# Patient Record
Sex: Female | Born: 1965 | Race: White | Hispanic: No | Marital: Single | Smoking: Current every day smoker
Health system: Southern US, Community
[De-identification: ages and names within clinical notes are randomized; demographics above are authoritative.]

## PROBLEM LIST (undated history)

## (undated) DIAGNOSIS — I10 Essential (primary) hypertension: Secondary | ICD-10-CM

## (undated) DIAGNOSIS — K219 Gastro-esophageal reflux disease without esophagitis: Secondary | ICD-10-CM

## (undated) DIAGNOSIS — E785 Hyperlipidemia, unspecified: Secondary | ICD-10-CM

## (undated) HISTORY — PX: BLADDER SURGERY: SHX569

## (undated) HISTORY — DX: Hyperlipidemia, unspecified: E78.5

---

## 2005-11-20 ENCOUNTER — Emergency Department: Payer: Self-pay | Admitting: Emergency Medicine

## 2006-09-24 ENCOUNTER — Ambulatory Visit: Payer: Self-pay | Admitting: Unknown Physician Specialty

## 2007-02-03 ENCOUNTER — Emergency Department: Payer: Self-pay | Admitting: Emergency Medicine

## 2008-04-02 ENCOUNTER — Emergency Department: Payer: Self-pay | Admitting: Emergency Medicine

## 2010-03-20 ENCOUNTER — Emergency Department: Payer: Self-pay | Admitting: Emergency Medicine

## 2010-10-26 ENCOUNTER — Emergency Department: Payer: Self-pay | Admitting: Emergency Medicine

## 2011-05-14 ENCOUNTER — Emergency Department: Payer: Self-pay | Admitting: Emergency Medicine

## 2012-09-24 ENCOUNTER — Emergency Department: Payer: Self-pay | Admitting: Emergency Medicine

## 2012-09-24 LAB — COMPREHENSIVE METABOLIC PANEL
Albumin: 3.6 g/dL (ref 3.4–5.0)
Alkaline Phosphatase: 86 U/L (ref 50–136)
Anion Gap: 5 — ABNORMAL LOW (ref 7–16)
BUN: 7 mg/dL (ref 7–18)
Bilirubin,Total: 0.5 mg/dL (ref 0.2–1.0)
Calcium, Total: 8.7 mg/dL (ref 8.5–10.1)
Chloride: 105 mmol/L (ref 98–107)
Creatinine: 0.77 mg/dL (ref 0.60–1.30)
EGFR (African American): >60
EGFR (Non-African Amer.): >60
Glucose: 139 mg/dL — ABNORMAL HIGH (ref 65–99)
Osmolality: 280 (ref 275–301)
Potassium: 4.2 mmol/L (ref 3.5–5.1)
SGOT(AST): 22 U/L (ref 15–37)
SGPT (ALT): 22 U/L (ref 12–78)
Sodium: 140 mmol/L (ref 136–145)

## 2012-09-24 LAB — CK TOTAL AND CKMB (NOT AT ARMC)
CK, Total: 100 U/L (ref 21–215)
CK-MB: 1.2 ng/mL (ref 0.5–3.6)

## 2012-09-24 LAB — CBC
HCT: 44.1 % (ref 35.0–47.0)
HGB: 15.5 g/dL (ref 12.0–16.0)
MCHC: 35.2 g/dL (ref 32.0–36.0)
RBC: 4.6 10*6/uL (ref 3.80–5.20)
RDW: 12 % (ref 11.5–14.5)

## 2012-09-24 LAB — ETHANOL
Ethanol %: <0.003 % (ref 0.000–0.080)
Ethanol: <3 mg/dL

## 2012-09-24 LAB — TROPONIN I: Troponin-I: <0.02 ng/mL

## 2013-07-29 ENCOUNTER — Emergency Department: Payer: Self-pay | Admitting: Emergency Medicine

## 2014-01-16 ENCOUNTER — Emergency Department: Payer: Self-pay | Admitting: Emergency Medicine

## 2014-07-05 ENCOUNTER — Emergency Department: Payer: Self-pay | Admitting: Emergency Medicine

## 2014-09-02 ENCOUNTER — Emergency Department: Payer: Self-pay | Admitting: Emergency Medicine

## 2014-09-02 LAB — URINALYSIS, COMPLETE
BILIRUBIN, UR: NEGATIVE
Bacteria: NONE SEEN
Glucose,UR: NEGATIVE mg/dL (ref 0–75)
KETONE: NEGATIVE
NITRITE: NEGATIVE
Ph: 6 (ref 4.5–8.0)
Protein: 30
SPECIFIC GRAVITY: 1.004 (ref 1.003–1.030)
Squamous Epithelial: NONE SEEN

## 2015-08-16 ENCOUNTER — Encounter: Payer: Self-pay | Admitting: Emergency Medicine

## 2015-08-16 ENCOUNTER — Emergency Department
Admission: EM | Admit: 2015-08-16 | Discharge: 2015-08-16 | Disposition: A | Discharge status: Home / Self Care | Payer: Self-pay | Attending: Emergency Medicine | Admitting: Emergency Medicine

## 2015-08-16 DIAGNOSIS — Z79899 Other long term (current) drug therapy: Secondary | ICD-10-CM | POA: Insufficient documentation

## 2015-08-16 DIAGNOSIS — I10 Essential (primary) hypertension: Secondary | ICD-10-CM | POA: Insufficient documentation

## 2015-08-16 DIAGNOSIS — K047 Periapical abscess without sinus: Secondary | ICD-10-CM | POA: Insufficient documentation

## 2015-08-16 DIAGNOSIS — Z880 Allergy status to penicillin: Secondary | ICD-10-CM | POA: Insufficient documentation

## 2015-08-16 DIAGNOSIS — F17200 Nicotine dependence, unspecified, uncomplicated: Secondary | ICD-10-CM | POA: Insufficient documentation

## 2015-08-16 HISTORY — DX: Gastro-esophageal reflux disease without esophagitis: K21.9

## 2015-08-16 HISTORY — DX: Essential (primary) hypertension: I10

## 2015-08-16 MED ORDER — CLINDAMYCIN HCL 300 MG PO CAPS: 300.0000 mg | ORAL_CAPSULE | Freq: Three times a day (TID) | ORAL | Status: DC

## 2015-08-16 MED ORDER — OXYCODONE-ACETAMINOPHEN 5-325 MG PO TABS: 1.0000 | ORAL_TABLET | ORAL | Status: DC | PRN

## 2015-08-16 NOTE — ED Provider Notes (Signed)
[facility] Emergency Department Provider Note ____________________________________________  Time seen: Approximately 7:36 AM  I have reviewed the triage vital signs and the nursing notes.   HISTORY  Chief Complaint Dental Pain    HPI Michele Bernard is a 50 y.o. female who presents today with pain and swelling to the lower left side of her jaw. She does not currently have a dentist and states she had a loose tooth and extracted it herself on Monday. Has been treating by swishing with warm salt water. It was not painful until yesterday when she noticed swelling in her jaw and pain started to increase. Her boyfriend had some Keflex left over from a previous tooth infection he had, so she took 2 Keflex last night and 2 ibuprofen 800 with minimal relief. She is able to eat on other side of her mouth and unable on the left side. Patient states that she has felt feverish and had chills but has not taken her temperature. Patient denies headache, swelling of neck, shortness of breath, dyspnea, chest pain.   Past Medical History  Diagnosis Date  . Hypertension   . GERD (gastroesophageal reflux disease)     There are no active problems to display for this patient.   History reviewed. No pertinent past surgical history.  Current Outpatient Rx  Name  Route  Sig  Dispense  Refill  . omeprazole (PRILOSEC) 40 MG capsule   Oral   Take 40 mg by mouth daily.         . clindamycin (CLEOCIN) 300 MG capsule   Oral   Take 1 capsule (300 mg total) by mouth 3 (three) times daily.   30 capsule   0   . oxyCODONE-acetaminophen (ROXICET) 5-325 MG tablet   Oral   Take 1-2 tablets by mouth every 4 (four) hours as needed for severe pain.   15 tablet   0     Allergies Penicillins  No family history on file.  Social History Social History  Substance Use Topics  . Smoking status: Current Every Day Smoker  . Smokeless tobacco: None  . Alcohol Use: No    Review of  Systems Constitutional: Possible fever/chills Eyes: No visual changes. ENT: Positive for swelling and pain to jaw. No sore throat. Cardiovascular: Denies chest pain. Respiratory: Denies shortness of breath or difficulty breathing Gastrointestinal: No abdominal pain.  No nausea, no vomiting.  No diarrhea.  No constipation. Genitourinary: Negative for dysuria. Musculoskeletal: Negative for back pain. Skin: Negative for rash. Neurological: Negative for headaches, focal weakness or numbness. Lymphatic:Denies swelling in the neck  10-point ROS otherwise negative.  ____________________________________________   PHYSICAL EXAM:  VITAL SIGNS: ED Triage Vitals  Enc Vitals Group     BP 08/16/15 0718 161/98 mmHg     Pulse Rate 08/16/15 0718 94     Resp 08/16/15 0718 18     Temp 08/16/15 0718 97.6 F (36.4 C)     Temp src --      SpO2 08/16/15 0718 96 %     Weight 08/16/15 0718 130 lb (58.968 kg)     Height 08/16/15 0718  (1.676 m)     Head Cir --      Peak Flow --      Pain Score 08/16/15 0719 10     Pain Loc --      Pain Edu? --      Excl. in GC? --     Constitutional: Alert and oriented. Well appearing  and in no acute distress. Head: Atraumatic. Nose: No congestion/rhinnorhea. Mouth/Throat: Evidence of recently extracted lower left first molar. No exudates or active bleeding. Swelling to left side of mouth noted. Mucous membranes are moist.  Oropharynx non-erythematous. Neck: No stridor.  Lymphatic/Immunilogical: No cervical lymphadenopathy. Cardiovascular: Normal rate, regular rhythm. Grossly normal heart sounds.  Good peripheral circulation. Respiratory: Normal respiratory effort.  No retractions. Lungs CTAB. Musculoskeletal: No lower extremity tenderness nor edema.  No joint effusions. Neurologic:  Normal speech and language. No gross focal neurologic deficits are appreciated. No gait instability. Skin:  Skin is warm, dry and intact. No rash noted. Psychiatric: Mood  and affect are normal. Speech and behavior are normal.  ____________________________________________   LABS (all labs ordered are listed, but only abnormal results are displayed)  Labs Reviewed - No data to display ____________________________________________   PROCEDURES  Procedure(s) performed: None  Critical Care performed: No  ____________________________________________   INITIAL IMPRESSION / ASSESSMENT AND PLAN / ED COURSE  Pertinent labs & imaging results that were available during my care of the patient were reviewed by me and considered in my medical decision making (see chart for details).  Michele Bernard is a 50 y.o. female who presents today with pain and swelling to the lower left side of her jaw after self extracting a tooth which has now become infected. Will give her Percocet for pain and clindamycin 300 and give her follow-up info for a dental clinic. Patient voices no other emergency medical complaints at this visit. ____________________________________________   FINAL CLINICAL IMPRESSION(S) / ED DIAGNOSES  Final diagnoses:  Dental abscess      Evangeline Dakin, PA-C 08/16/15 1610  Richardean Canal, MD 08/16/15 [phone]

## 2015-08-16 NOTE — ED Notes (Signed)
Pt discharged home after verbalizing understanding of discharge instructions; nad noted. 

## 2015-08-16 NOTE — ED Notes (Addendum)
Swelling noted to left side of face  States she pulled her own tooth on monday

## 2015-08-16 NOTE — Discharge Instructions (Signed)
OPTIONS FOR DENTAL FOLLOW UP CARE ° °[place] [facility] and [facility] Services - Local Safety Net Dental Clinics °http://www.ncdhhs.gov/dph/oralhealth/services/safetynetclinics.htm °  °[facility] (336-562-3123) ° °[facility] [place] (919-933-9087) ° °[facility] (919-663-1744 ext [phone]) ° °[facility] ([phone]) ° °[facility] ([phone]) °This clinic caters to the indigent population and is on a lottery system. °Location: °[facility], [address], [place] °Clinic Hours: °Wednesdays from 6pm - 9pm, patients seen by a lottery system. °For dates, call or go to [email or web address]/[facility]/patients/Dental-[facility] °Services: °Cleanings, fillings and simple extractions. °Payment Options: °DENTAL WORK IS FREE OF CHARGE. Bring proof of income or support. °Best way to get seen: °Arrive at 5:15 pm - this is a lottery, NOT first come/first serve, so arriving earlier will not increase your chances of being seen. °  °  °[facility] °[phone] °Select option 1 for emergencies °  °Location: °[facility], [address], [place] °Clinic Hours: °No walk-ins accepted - call the day before to schedule an appointment. °Check in times are 9:30 am and 1:30 pm. °Services: °Simple extractions, temporary fillings, pulpectomy/pulp debridement, uncomplicated abscess drainage. °Payment Options: °PAYMENT IS DUE AT THE TIME OF SERVICE.  Fee is usually $100-200, additional surgical procedures (e.g. abscess drainage) may be extra. °Cash, checks, Visa/MasterCard accepted.  Can file Medicaid if patient is covered for dental - patient should call case worker to check. °No discount for [facility] patients. °Best way to get seen: °MUST call the day before and get onto the schedule. Can usually be seen the next 1-2 days. No walk-ins accepted. °  °  °[facility] °[phone] °   °Location: °[facility], [address], [place] °Clinic Hours: °M, W, Th, F 8am or 1:30pm, Tues 9a or 1:30 - first come/first served. °Services: °Simple extractions, temporary fillings, uncomplicated abscess drainage.  You do not need to be an [place] resident. °Payment Options: °PAYMENT IS DUE AT THE TIME OF SERVICE. °Dental insurance, otherwise sliding scale - bring proof of income or support. °Depending on income and treatment needed, cost is usually $50-200. °Best way to get seen: °Arrive early as it is first come/first served. °  °  °[facility] °[phone] °  °Location: °[address] °Clinic Hours: °Mon-Thu 8a-5p °Services: °Most basic dental services including extractions and fillings. °Payment Options: °PAYMENT IS DUE AT THE TIME OF SERVICE. °Sliding scale, up to 50% off - bring proof if income or support. °Medicaid with dental option accepted. °Best way to get seen: °Call to schedule an appointment, can usually be seen within 2 weeks OR they will try to see walk-ins - show up at 8a or 2p (you may have to wait). °  °  °[facility] °919-245-2435 °[place] RESIDENTS ONLY °  °Location: °[facility], [address], [place],  [address] °Clinic Hours: By appointment only. °Monday - Thursday 8am-5pm, Friday 8am-12pm °Services: Cleanings, fillings, extractions. °Payment Options: °PAYMENT IS DUE AT THE TIME OF SERVICE. °Cash, Visa or MasterCard. Sliding scale - $30 minimum per service. °Best way to get seen: °Come in to office, complete packet and make an appointment - need proof of income °or support monies for each household member and proof of [place] residence. °Usually takes about a month to get in. °  °  °[facility] °[phone] °  °Location: °[address]   [place] °Clinic Hours: Walk-in Urgent Care Dental Services are offered Monday-Friday  mornings only. °The numbers of emergencies accepted daily is limited to the number of °providers available. °Maximum 15 - Mondays, Wednesdays & Thursdays °Maximum 10 - Tuesdays & Fridays °Services: °You do not need to be a [place] resident to be seen for a dental emergency. °Emergencies are defined as pain, swelling, abnormal bleeding, or dental trauma. Walkins will receive x-rays if needed. °NOTE: Dental cleaning is not an emergency. °Payment Options: °PAYMENT IS DUE AT THE TIME OF SERVICE. °Minimum co-pay is $40.00 for uninsured patients. °Minimum co-pay is $3.00 for Medicaid with dental coverage. °Dental Insurance is accepted and must be presented at time of visit. °Medicare does not cover dental. °Forms of payment: Cash, credit card, checks. °Best way to get seen: °If not previously registered with the clinic, walk-in dental registration begins at 7:15 am and is on a first come/first serve basis. °If previously registered with the clinic, call to make an appointment. °  °  °[facility] °919-776-4359 °[place] RESIDENTS ONLY °  °Location: °[address], [place], [place] °Clinic Hours: °Mon-Thu 10a-2p °Services: Extractions only! °Payment Options: °FREE (donations accepted) - bring proof of income or support °Best way to get seen: °Call and schedule an appointment OR come at 8am on the 1st Monday of every month (except for holidays) when it is first come/first served. °  °  °[facility] °[phone] °  °Location: °[address], [place] °Clinic Hours: °Friday mornings °Services, Payment Options, Best way to get seen: °Call for info ° ° °Dental Abscess °A dental abscess is a collection of pus in or around a tooth. °CAUSES °This condition is caused by a bacterial infection around the root of the tooth that involves the inner part of the tooth (pulp). It may result from: °· Severe tooth decay. °· Trauma to the tooth that allows bacteria to enter into the pulp, such as a broken or chipped  tooth. °· Severe gum disease around a tooth. °SYMPTOMS °Symptoms of this condition include: °· Severe pain in and around the infected tooth. °· Swelling and redness around the infected tooth, in the mouth, or in the face. °· Tenderness. °· Pus drainage. °· Bad breath. °· Bitter taste in the mouth. °· Difficulty swallowing. °· Difficulty opening the mouth. °· Nausea. °· Vomiting. °· Chills. °· Swollen neck glands. °· Fever. °DIAGNOSIS °This condition is diagnosed with examination of the infected tooth. During the exam, your dentist may tap on the infected tooth. Your dentist will also ask about your medical and dental history and may order X-rays. °TREATMENT °This condition is treated by eliminating the infection. This may be done with: °· Antibiotic medicine. °· A root canal. This may be performed to save the tooth. °· Pulling (extracting) the tooth. This may also involve draining the abscess. This is done if the tooth cannot be saved. °HOME CARE INSTRUCTIONS °· Take medicines only as directed by your dentist. °· If you were prescribed antibiotic medicine, finish all of it even if you start to feel better. °· Rinse your mouth (gargle) often with salt water to relieve pain or swelling. °· Do not drive or operate heavy machinery while taking pain medicine. °· Do not apply heat to the outside of your mouth. °· Keep all follow-up visits as directed by your dentist. This is important. °SEEK MEDICAL CARE IF: °· Your pain is worse and is not helped by medicine. °SEEK IMMEDIATE MEDICAL CARE IF: °·   You have a fever or chills.  Your symptoms suddenly get worse.  You have a very bad headache.  You have problems breathing or swallowing.  You have trouble opening your mouth.  You have swelling in your neck or around your eye.   This information is not intended to replace advice given to you by your health care provider. Make sure you discuss any questions you have with your health care provider.   Document  Released: 07/14/2005 Document Revised: 11/28/2014 Document Reviewed: 07/11/2014 Elsevier Interactive Patient Education 2016 Else[employer] Inc[employer]  Dental Care and Dentist Visits Dental care supports good overall health. Regular dental visits can also help you avoid dental pain, bleeding, infection, and other more serious health problems in the future. It is important to keep the mouth healthy because diseases in the teeth, gums, and other oral tissues can spread to other areas of the body. Some problems, such as diabetes, heart disease, and pre-term labor have been associated with poor oral health.  See your dentist every 6 months. If you experience emergency problems such as a toothache or broken tooth, go to the dentist right away. If you see your dentist regularly, you may catch problems early. It is easier to be treated for problems in the early stages.  WHAT TO EXPECT AT A DENTIST VISIT  Your dentist will look for many common oral health problems and recommend proper treatment. At your regular dental visit, you can expect:  Gentle cleaning of the teeth and gums. This includes scraping and polishing. This helps to remove the sticky substance around the teeth and gums (plaque). Plaque forms in the mouth shortly after eating. Over time, plaque hardens on the teeth as tartar. If tartar is not removed regularly, it can cause problems. Cleaning also helps remove stains.  Periodic X-rays. These pictures of the teeth and supporting bone will help your dentist assess the health of your teeth.  Periodic fluoride treatments. Fluoride is a natural mineral shown to help strengthen teeth. Fluoride treatmentinvolves applying a fluoride gel or varnish to the teeth. It is most commonly done in children.  Examination of the mouth, tongue, jaws, teeth, and gums to look for any oral health problems, such as:  Cavities (dental caries). This is decay on the tooth caused by plaque, sugar, and acid in the mouth. It is best  to catch a cavity when it is small.  Inflammation of the gums caused by plaque buildup (gingivitis).  Problems with the mouth or malformed or misaligned teeth.  Oral cancer or other diseases of the soft tissues or jaws. KEEP YOUR TEETH AND GUMS HEALTHY For healthy teeth and gums, follow these general guidelines as well as your dentist's specific advice:  Have your teeth professionally cleaned at the dentist every 6 months.  Brush twice daily with a fluoride toothpaste.  Floss your teeth daily.  Ask your dentist if you need fluoride supplements, treatments, or fluoride toothpaste.  Eat a healthy diet. Reduce foods and drinks with added sugar.  Avoid smoking. TREATMENT FOR ORAL HEALTH PROBLEMS If you have oral health problems, treatment varies depending on the conditions present in your teeth and gums.  Your caregiver will most likely recommend good oral hygiene at each visit.  For cavities, gingivitis, or other oral health disease, your caregiver will perform a procedure to treat the problem. This is typically done at a separate appointment. Sometimes your caregiver will refer you to another dental specialist for specific tooth problems or for surgery. SEEK IMMEDIATE DENTAL  CARE IF:  You have pain, bleeding, or soreness in the gum, tooth, jaw, or mouth area.  A permanent tooth becomes loose or separated from the gum socket.  You experience a blow or injury to the mouth or jaw area.   This information is not intended to replace advice given to you by your health care provider. Make sure you discuss any questions you have with your health care provider.   Document Released: 03/26/2011 Document Revised: 10/06/2011 Document Reviewed: 03/26/2011 Elsevier Interactive Patient Education [employer]

## 2015-08-16 NOTE — ED Notes (Addendum)
Pt reports that she had a loose tooth that she pulled out from her left side on Monday; states she is having pain that "feels like a dry socket." States she took 2 leftover keflex last night that were left over from boyfriend's dental problem. Pt states she is having 10/10 pain as well as swelling. Has been taking ibuprofen and goody powders with no relief.

## 2016-03-12 ENCOUNTER — Encounter (INDEPENDENT_AMBULATORY_CARE_PROVIDER_SITE_OTHER): Payer: Self-pay

## 2016-03-12 ENCOUNTER — Ambulatory Visit
Admission: RE | Admit: 2016-03-12 | Discharge: 2016-03-12 | Disposition: A | Discharge status: Home / Self Care | Payer: Self-pay | Source: Ambulatory Visit | Attending: Oncology | Admitting: Oncology

## 2016-03-12 ENCOUNTER — Ambulatory Visit: Payer: Self-pay | Attending: Internal Medicine | Admitting: *Deleted

## 2016-03-12 ENCOUNTER — Encounter: Payer: Self-pay | Admitting: *Deleted

## 2016-03-12 VITALS — BP 147/91 | HR 82 | Temp 97.9°F | Ht 66.54 in | Wt 121.3 lb

## 2016-03-12 DIAGNOSIS — Z0000 Encounter for general adult medical examination without abnormal findings: Secondary | ICD-10-CM

## 2016-03-12 NOTE — Progress Notes (Signed)
Subjective:     Patient ID: Michele Bernard, female   DOB: May 07, 1966, 50 y.o.   MRN: [id number]  HPI   Review of Systems     Objective:   Physical Exam  Pulmonary/Chest: Right breast exhibits no inverted nipple, no mass, no nipple discharge, no skin change and no tenderness. Left breast exhibits no inverted nipple, no mass, no nipple discharge, no skin change and no tenderness. Breasts are symmetrical.  Abdominal: There is no splenomegaly or hepatomegaly.  Genitourinary:    No labial fusion. There is no rash, tenderness, lesion or injury on the right labia. There is no rash, tenderness, lesion or injury on the left labia. Cervix exhibits no motion tenderness, no discharge and no friability. Right adnexum displays no mass, no tenderness and no fullness. Left adnexum displays no mass, no tenderness and no fullness. No erythema, tenderness or bleeding in the vagina. No foreign body in the vagina. No signs of injury around the vagina. No vaginal discharge found.       Assessment:     50 year old White female [facility], pap and mammogram.  Clinical breast exam unremarkable.  Taught self breast awareness.  Specimen collected for pap smear.  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  Blood pressure elevated at 147/91.  She is to recheck her blood pressure at [facility] or [facility], and if remains higher than 140/90 she is to follow-up with her primary care provider.  Hand out on hypertention given to patient.  States she just started on blood pressure meds and has a follow-up appointment on the 24th of this month.  Patient has been screened for eligibility.  She does not have any insurance, Medicare or Medicaid.  She also meets financial eligibility.  Hand-out given on the Affordable Care Act.    Plan:     Screening mammogram ordered.  Specimen sent to the lab.  Patient scheduled to see a GYN at [facility] on 04/16/16 for  further evaluation of the urethral caruncle.  Will follow up per [facility] protocol.

## 2016-03-12 NOTE — Patient Instructions (Signed)
Human Papillomavirus Human papillomavirus (HPV) is the most common sexually transmitted infection (STI) and is highly contagious. HPV infections cause genital warts and cancers to the outlet of the womb (cervix), birth canal (vagina), opening of the birth canal (vulva), and anus. There are over 100 types of HPV. Unless wartlike lesions are present in the throat or there are genital warts that you can see or feel, HPV usually does not cause symptoms. It is possible to be infected for long periods and pass it on to others without knowing it. CAUSES  HPV is spread from person to person through sexual contact. This includes oral, vaginal, or anal sex. RISK FACTORS  Having unprotected sex. HPV can be spread by oral, vaginal, or anal sex.  Having several sex partners.  Having a sex partner who has other sex partners.  Having or having had another sexually transmitted infection. SIGNS AND SYMPTOMS  Most people carrying HPV do not have any symptoms. If symptoms are present, symptoms may include:  Wartlike lesions in the throat (from having oral sex).  Warts in the infected skin or mucous membranes.  Genital warts that may itch, burn, or bleed.  Genital warts that may be painful or bleed during sexual intercourse. DIAGNOSIS  If wartlike lesions are present in the throat or genital warts are present, your health care provider can usually diagnose HPV by physical examination.   Genital warts are easily seen with the naked eye.  Currently, there is no FDA-approved test to detect HPV in males.  In females, a Pap test can show cells that are infected with HPV.  In females, a scope can be used to view the cervix (colposcopy). A colposcopy can be performed if the pelvic exam or Pap test is abnormal. A sample of tissue may be removed (biopsy) during the colposcopy. TREATMENT  There is no treatment for the virus itself. However, there are treatments for the health problems and symptoms HPV can cause.  Your health care provider will follow you closely after you are treated. This is because the HPV can come back and may need treatment again. Treatment of HPV may include:   Medicines, which may be injected or applied in a cream, lotion, or gel form.  Use of a probe to apply extreme cold (cryotherapy).  Application of an intense beam of light (laser treatment).  Use of a probe to apply extreme heat (electrocautery).  Surgery. HOME CARE INSTRUCTIONS   Take medicines only as directed by your health care provider.  Use over-the-counter creams for itching or irritation as directed by your health care provider.  Keep all follow-up visits as directed by your health care provider. This is important.  Do not touch or scratch the warts.  Do not treat genital warts with medicines used for treating hand warts.  Do not have sex while you are being treated.  Do not douche or use tampons during treatment of HPV.  Tell your sex partner about your infection because he or she may also need treatment.  If you become pregnant, tell your health care provider that you have had HPV. Your health care provider will monitor you closely during pregnancy to be sure your baby is safe.  After treatment, use condoms during sex to prevent future infections.  Have only one sex partner.  Have a sex partner who does not have other sex partners. PREVENTION   Talk to your health care provider about getting the HPV vaccines. These vaccines prevent some HPV infections and cancers.   It is recommended that the vaccine be given to males and females between the ages of 9 and 26 years old. It will not work if you already have HPV, and it is not recommended for pregnant women.  A Pap test is done to screen for cervical cancer in women.  The first Pap test should be done at age 21 years.  Between ages 21 and 29 years, Pap tests are repeated every 2 years.  Beginning at age 30, you are advised to have a Pap test every  3 years as long as your past 3 Pap tests have been normal.  Some women have medical problems that increase the chance of getting cervical cancer. Talk to your health care provider about these problems. It is especially important to talk to your health care provider if a new problem develops soon after your last Pap test. In these cases, your health care provider may recommend more frequent screening and Pap tests.  The above recommendations are the same for women who have or have not gotten the vaccine for HPV.  If you had a hysterectomy for a problem that was not a cancer or a condition that could lead to cancer, then you no longer need Pap tests. However, even if you no longer need a Pap test, a regular exam is a good idea to make sure no other problems are starting.   If you are between the ages of 65 and 70 years and you have had normal Pap tests going back 10 years, you no longer need Pap tests. However, even if you no longer need a Pap test, a regular exam is a good idea to make sure no other problems are starting.  If you have had past treatment for cervical cancer or a condition that could lead to cancer, you need Pap tests and screening for cancer for at least 20 years after your treatment.  If Pap tests have been discontinued, risk factors (such as a new sexual partner)need to be reassessed to determine if screening should be resumed.  Some women may need screenings more often if they are at high risk for cervical cancer. SEEK MEDICAL CARE IF:   The treated skin becomes red, swollen, or painful.  You have a fever.  You feel generally ill.  You feel lumps or pimple-like projections in and around your genital area.  You develop bleeding of the vagina or the treatment area.  You have painful sexual intercourse. MAKE SURE YOU:   Understand these instructions.  Will watch your condition.  Will get help if you are not doing well or get worse.   This information is not  intended to replace advice given to you by your health care provider. Make sure you discuss any questions you have with your health care provider.   Document Released: 10/04/2003 Document Revised: 08/04/2014 Document Reviewed: 10/19/2013 Elsevier Interactive Patient Education 2016 [employer]vier Inc.   Gave patient hand-out, Women Staying Healthy, Active and Well from [facility], with education on breast health, pap smears, heart and colon health. 

## 2016-03-13 ENCOUNTER — Other Ambulatory Visit: Payer: Self-pay | Admitting: *Deleted

## 2016-03-13 DIAGNOSIS — N63 Unspecified lump in unspecified breast: Secondary | ICD-10-CM

## 2016-03-14 LAB — PAP LB AND HPV HIGH-RISK
HPV, high-risk: NEGATIVE
PAP SMEAR COMMENT: 0

## 2016-03-18 ENCOUNTER — Other Ambulatory Visit: Payer: Self-pay | Admitting: *Deleted

## 2016-03-18 DIAGNOSIS — N63 Unspecified lump in unspecified breast: Secondary | ICD-10-CM

## 2016-04-02 ENCOUNTER — Ambulatory Visit: Payer: Self-pay

## 2016-04-02 ENCOUNTER — Ambulatory Visit: Admission: RE | Admit: 2016-04-02 | Payer: Self-pay | Source: Ambulatory Visit

## 2016-04-16 ENCOUNTER — Ambulatory Visit (INDEPENDENT_AMBULATORY_CARE_PROVIDER_SITE_OTHER): Payer: Self-pay | Admitting: Obstetrics and Gynecology

## 2016-04-16 ENCOUNTER — Encounter: Payer: Self-pay | Admitting: Obstetrics and Gynecology

## 2016-04-16 VITALS — BP 129/74 | HR 81 | Ht 66.0 in | Wt 120.2 lb

## 2016-04-16 DIAGNOSIS — N951 Menopausal and female climacteric states: Secondary | ICD-10-CM

## 2016-04-16 DIAGNOSIS — N362 Urethral caruncle: Secondary | ICD-10-CM

## 2016-04-16 NOTE — Progress Notes (Signed)
    GYNECOLOGY PROGRESS NOTE  Subjective:    Patient ID: Michele Bernard, female    DOB: Dec 10, 1965, 50 y.o.   MRN: [id number]  HPI  Patient is a 50 y.o. G2P2 menopausal female who [facility]. Patient denies complaints.     Past Medical History:  Diagnosis Date  . GERD (gastroesophageal reflux disease)   . Hypertension     Family History  Problem Relation Age of Onset  . Hypertension Mother   . Breast cancer Neg Hx    Past Surgical History:  Procedure Laterality Date  . BLADDER SURGERY     Bladder tact    . Social History   Social History  . Marital status: Single    Spouse name: N/A  . Number of children: N/A  . Years of education: N/A   Occupational History  . Not on file.   Social History Main Topics  . Smoking status: Current Every Day Smoker    Packs/day: 1.00    Years: 30.00    Types: Cigarettes  . Smokeless tobacco: Never Used  . Alcohol use No  . Drug use: No  . Sexual activity: Yes    Birth control/ protection: Post-menopausal   Other Topics Concern  . Not on file   Social History Narrative  . No narrative on file    Current Outpatient Prescriptions on File Prior to Visit  Medication Sig Dispense Refill  . omeprazole (PRILOSEC) 40 MG capsule Take 40 mg by mouth daily.     No current facility-administered medications on file prior to visit.     Allergies  Allergen Reactions  . Penicillins Hives     Review of Systems A comprehensive review of systems was negative.   Objective:   Blood pressure 129/74, pulse 81, height 5\' 6"  (1.676 m), weight 120 lb 3.2 oz (54.5 kg). General appearance: alert and no distress Pelvic: external genitalia normal, rectovaginal septum normal. Urethral caruncle present, nontender. Speculum exam not performed.  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  - Samples of premarin given. Use daily x 1 week, then decrease to twice weekly. If no improvement or resolution, may need referral to Urology for possible biopsy to rule out pathology.  F/u in 2 months.     Hildred LaserAnika Bona Hubbard, MD [facility]

## 2016-04-16 NOTE — Patient Instructions (Signed)
Use estrogen cream daily x 1 week, then decrease to twice weekly.  Use cream for total of 8 weeks.

## 2016-04-18 ENCOUNTER — Ambulatory Visit: Payer: Self-pay

## 2016-04-18 ENCOUNTER — Other Ambulatory Visit: Payer: Self-pay

## 2016-04-19 DIAGNOSIS — N362 Urethral caruncle: Secondary | ICD-10-CM | POA: Insufficient documentation

## 2016-05-08 ENCOUNTER — Ambulatory Visit: Admission: RE | Admit: 2016-05-08 | Payer: Self-pay | Source: Ambulatory Visit

## 2016-05-08 ENCOUNTER — Other Ambulatory Visit: Payer: Self-pay

## 2016-05-22 ENCOUNTER — Encounter: Payer: Self-pay | Admitting: *Deleted

## 2016-05-22 ENCOUNTER — Telehealth: Payer: Self-pay | Admitting: *Deleted

## 2016-05-22 NOTE — Telephone Encounter (Signed)
Patient no-showed for her additional views.  Tried to phone patient to reschedule her appointment, but no answer.   Unable to leave a message.  Certified letter mailed to patient to stress importance of follow-up.

## 2016-06-11 ENCOUNTER — Ambulatory Visit: Payer: Self-pay | Admitting: Obstetrics and Gynecology

## 2016-06-17 ENCOUNTER — Ambulatory Visit
Admission: RE | Admit: 2016-06-17 | Discharge: 2016-06-17 | Disposition: A | Discharge status: Home / Self Care | Payer: Self-pay | Source: Ambulatory Visit | Attending: Oncology | Admitting: Oncology

## 2016-06-17 ENCOUNTER — Other Ambulatory Visit: Payer: Self-pay

## 2016-06-17 DIAGNOSIS — N63 Unspecified lump in unspecified breast: Secondary | ICD-10-CM

## 2016-07-03 ENCOUNTER — Ambulatory Visit
Admission: RE | Admit: 2016-07-03 | Discharge: 2016-07-03 | Disposition: A | Discharge status: Home / Self Care | Payer: Self-pay | Source: Ambulatory Visit | Attending: Oncology | Admitting: Oncology

## 2016-07-03 ENCOUNTER — Ambulatory Visit: Payer: Self-pay | Admitting: Obstetrics and Gynecology

## 2016-07-03 DIAGNOSIS — N63 Unspecified lump in unspecified breast: Secondary | ICD-10-CM

## 2016-07-03 HISTORY — PX: BREAST BIOPSY: SHX20

## 2016-07-04 LAB — SURGICAL PATHOLOGY

## 2016-07-09 ENCOUNTER — Encounter: Payer: Self-pay | Admitting: *Deleted

## 2016-07-09 NOTE — Progress Notes (Signed)
Patient with benign biopsy.  To follow up in one year with annual screening.  [facility] to [place].

## 2016-07-17 ENCOUNTER — Ambulatory Visit: Payer: Self-pay | Admitting: Obstetrics and Gynecology

## 2017-01-15 IMAGING — MG US BREAST BX W LOC DEV 1ST LESION IMG BX SPEC US GUIDE*L*
1 series · 8 of 8 positions shown · non-contrast
Comparison: Previous exam(s).

ADDENDUM:
Pathology of the left breast biopsy revealed BREAST, LEFT, [DATE];
ULTRASOUND GUIDED BIOPSY: BENIGN BREAST TISSUE WITH CYST FORMATION.
MICROCALCIFICATIONS ASSOCIATED WITH BENIGN MAMMARY GLANDS. NEGATIVE
FOR ATYPIA AND MALIGNANCY. This was found to be concordant by Dr.
[place].

Recommendations: Return to routine screening mammography in one
year.
At the patient's request, pathology and recommendations were relayed
to the patient by phone. She stated she has done well following the
biopsy with no bleeding, bruising, or hematoma. Post biopsy
instructions were reviewed with the patient. All of her questions
were answered. She was encouraged to call the [HOSPITAL]
with any further questions or concerns.
Pathology and recommendations relayed by Menzies, Kailash on
07/04/16.
CLINICAL DATA: Ultrasound-guided core needle biopsy of left breast
8 o'clock nodule.
EXAM:
ULTRASOUND GUIDED LEFT BREAST CORE NEEDLE BIOPSY

[Series 1: MG view · 0.03mm/px · 8 of 11 slices shown]
[im 1/11]
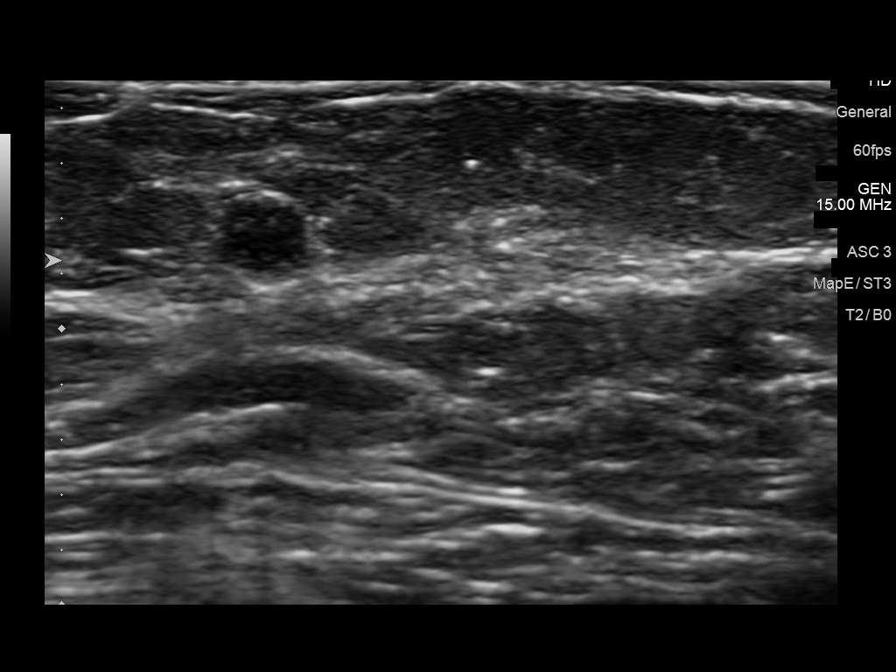
[im 2/11]
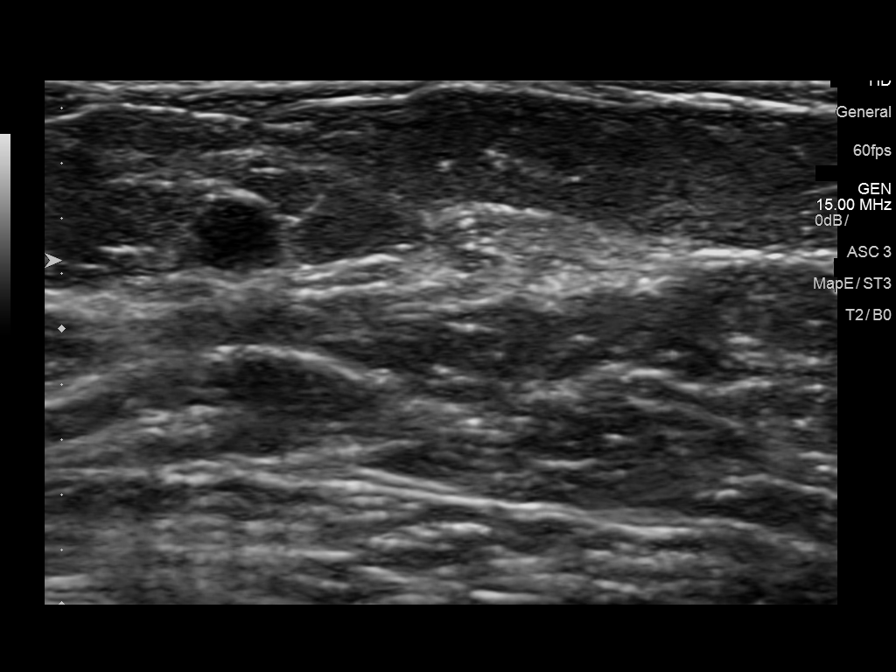
[im 3/11]
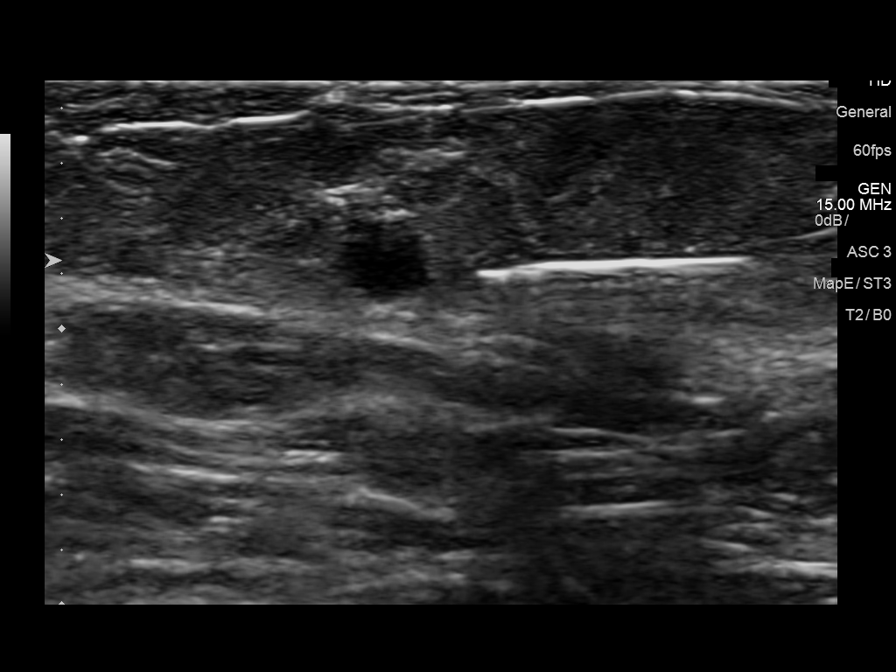
[im 5/11]
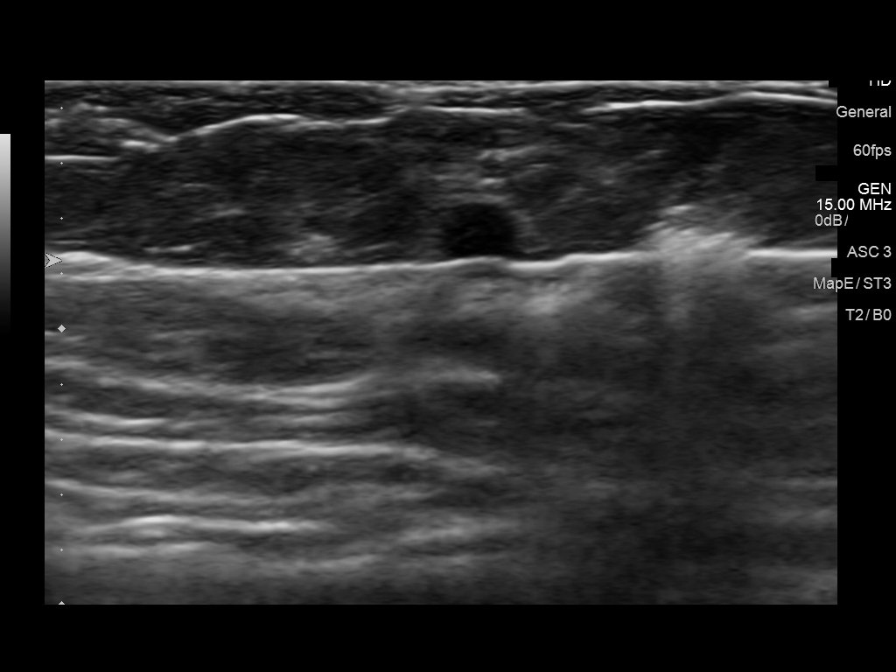
[im 6/11]
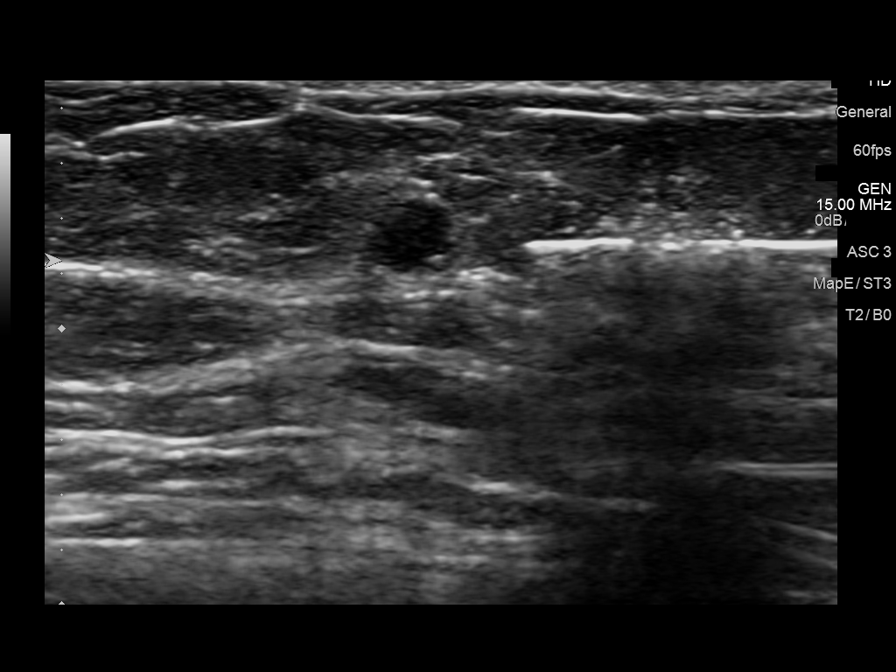
[im 8/11]
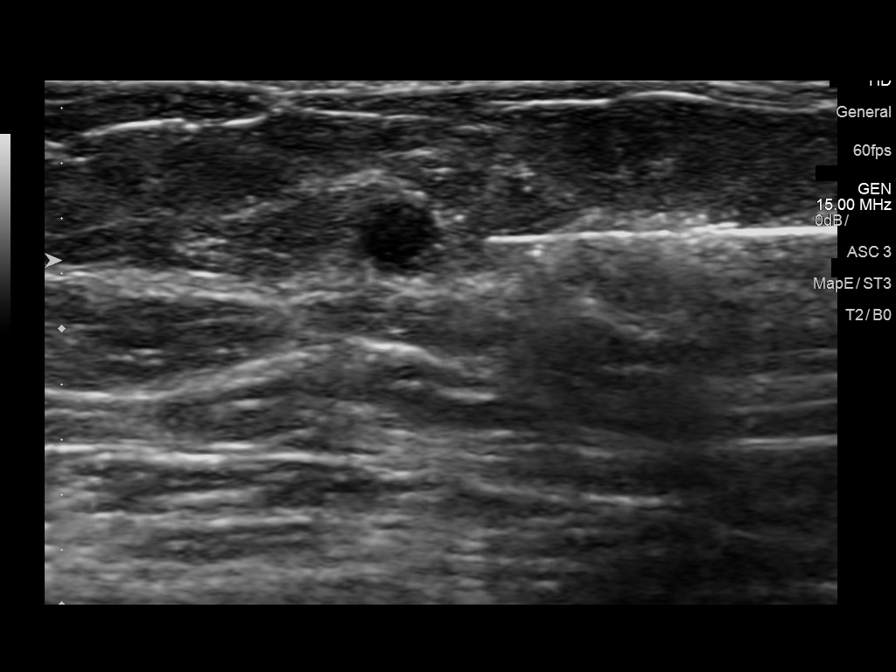
[im 9/11]
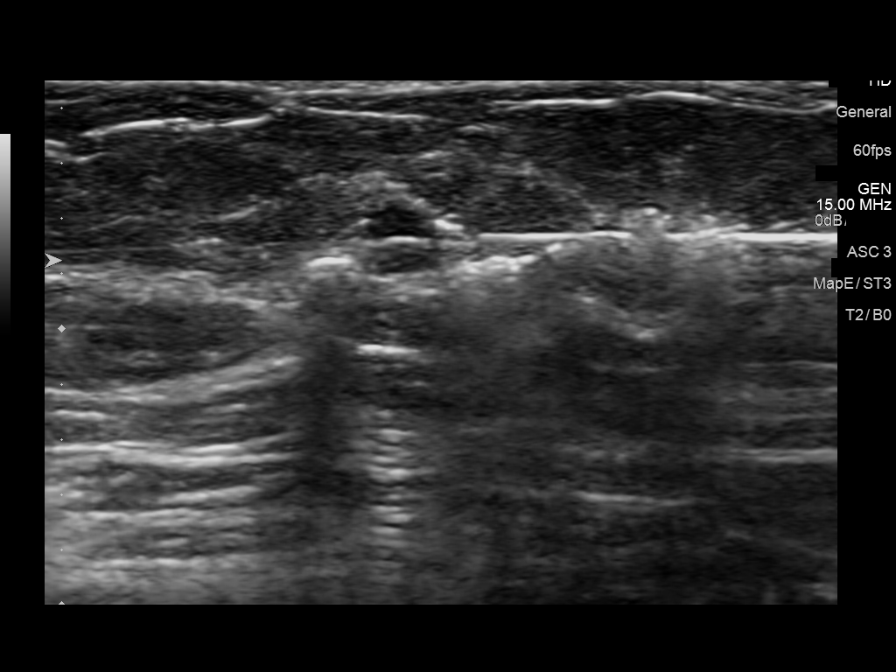
[im 11/11]
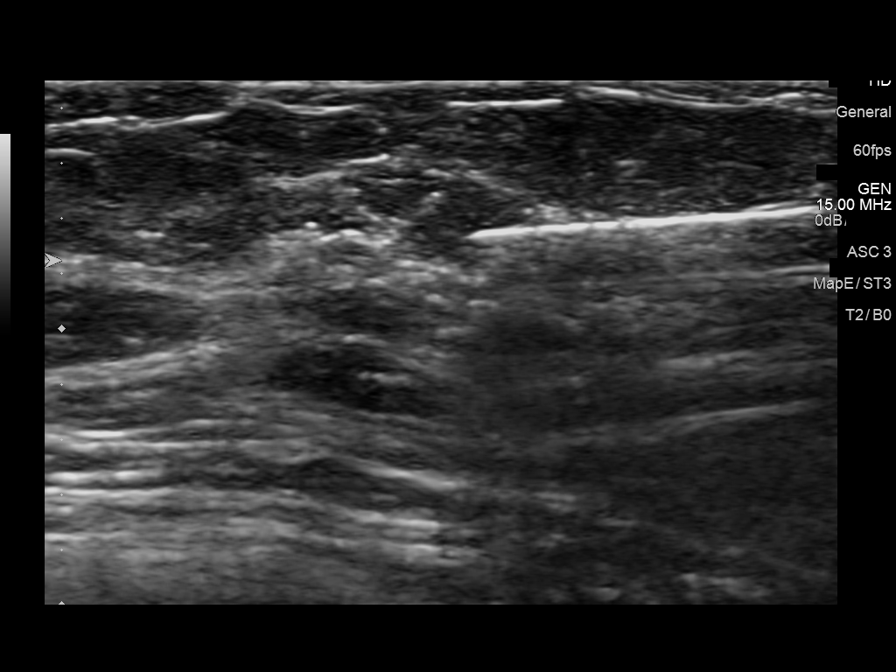

[8 of 8 positions shown; findings below may reference images not displayed]



Using sterile technique and 1% Lidocaine as local anesthetic, under
direct ultrasound visualization, a 14 gauge Guy Soo device was
used to perform biopsy of left breast 8 o'clock nodule using a
lateral approach. At the conclusion of the procedure a HydroMARK 4
tissue marker clip was deployed into the biopsy cavity. Follow up 2
view mammogram was performed and dictated separately.
IMPRESSION: Ultrasound guided biopsy of of the left breast. No apparent
complications.

## 2017-07-13 ENCOUNTER — Emergency Department: Payer: Self-pay

## 2017-07-13 ENCOUNTER — Other Ambulatory Visit: Payer: Self-pay

## 2017-07-13 ENCOUNTER — Emergency Department
Admission: EM | Admit: 2017-07-13 | Discharge: 2017-07-13 | Disposition: A | Discharge status: Home / Self Care | Payer: Self-pay | Attending: Emergency Medicine | Admitting: Emergency Medicine

## 2017-07-13 ENCOUNTER — Encounter: Payer: Self-pay | Admitting: Emergency Medicine

## 2017-07-13 DIAGNOSIS — F17210 Nicotine dependence, cigarettes, uncomplicated: Secondary | ICD-10-CM | POA: Insufficient documentation

## 2017-07-13 DIAGNOSIS — Z79899 Other long term (current) drug therapy: Secondary | ICD-10-CM | POA: Insufficient documentation

## 2017-07-13 DIAGNOSIS — J209 Acute bronchitis, unspecified: Secondary | ICD-10-CM | POA: Insufficient documentation

## 2017-07-13 DIAGNOSIS — I10 Essential (primary) hypertension: Secondary | ICD-10-CM | POA: Insufficient documentation

## 2017-07-13 MED ORDER — PREDNISONE 50 MG PO TABS: 50.0000 mg | ORAL_TABLET | Freq: Every day | ORAL | 0 refills | Status: DC

## 2017-07-13 MED ORDER — ALBUTEROL SULFATE (2.5 MG/3ML) 0.083% IN NEBU
2.5000 mg | INHALATION_SOLUTION | Freq: Once | RESPIRATORY_TRACT | Status: AC
  Administered 2017-07-13: 2.5 mg via RESPIRATORY_TRACT
  Filled 2017-07-13: qty 3

## 2017-07-13 MED ORDER — ALBUTEROL SULFATE HFA 108 (90 BASE) MCG/ACT IN AERS: 2.0000 | INHALATION_SPRAY | RESPIRATORY_TRACT | 0 refills | Status: AC | PRN

## 2017-07-13 MED ORDER — METHYLPREDNISOLONE SODIUM SUCC 125 MG IJ SOLR
125.0000 mg | Freq: Once | INTRAMUSCULAR | Status: AC
  Administered 2017-07-13: 125 mg via INTRAMUSCULAR
  Filled 2017-07-13: qty 2

## 2017-07-13 MED ORDER — PSEUDOEPH-BROMPHEN-DM 30-2-10 MG/5ML PO SYRP: 10.0000 mL | ORAL_SOLUTION | Freq: Four times a day (QID) | ORAL | 0 refills | Status: DC | PRN

## 2017-07-13 NOTE — ED Triage Notes (Signed)
Pt with cough for a couple of days.

## 2017-07-13 NOTE — ED Notes (Signed)
Pt reports sinus congestion, cough for 2 weeks. Pt taking otc meds without relief.  cig smoker.   No fever .  Pt alert.

## 2017-07-13 NOTE — ED Provider Notes (Signed)
[facility] Emergency Department Provider Note  ____________________________________________  Time seen: Approximately 6:24 PM  I have reviewed the triage vital signs and the nursing notes.   HISTORY  Chief Complaint Cough    HPI Michele Bernard is a 51 y.o. female who presents emergency department with a week history of cough.  Patient has had some mild nasal congestion and sore throat in addition to her cough.  No frank fevers or chills, chest pain, shortness of breath, abdominal pain, nausea vomiting.  Patient does have coughing spells keep her awake at night.  She feels short of breath after coughing.  She reports that she is tried multiple over-the-counter medications with no relief.  No history of asthma.  No headache, visual changes, neck pain, chest pain, abdominal pain, nausea vomiting, diarrhea or constipation.  No other complaints.  Past Medical History:  Diagnosis Date  . GERD (gastroesophageal reflux disease)   . Hypertension     Patient Active Problem List   Diagnosis Date Noted  . Urethral caruncle 04/19/2016    Past Surgical History:  Procedure Laterality Date  . BLADDER SURGERY     Bladder tact  . BREAST BIOPSY Left 07/03/2016   path pending    Prior to Admission medications   Medication Sig Start Date End Date Taking? Authorizing Provider  albuterol (PROVENTIL HFA;VENTOLIN HFA) 108 (90 Base) MCG/ACT inhaler Inhale 2 puffs into the lungs every 4 (four) hours as needed for wheezing or shortness of breath. 07/13/17   Cuthriell, Delorise RoyalsJonathan D, PA-C  brompheniramine-pseudoephedrine-DM 30-2-10 MG/5ML syrup Take 10 mLs by mouth 4 (four) times daily as needed. 07/13/17   Cuthriell, Delorise RoyalsJonathan D, PA-C  omeprazole (PRILOSEC) 40 MG capsule Take 40 mg by mouth daily.    [provider]  predniSONE (DELTASONE) 50 MG tablet Take 1 tablet (50 mg total) by mouth daily with breakfast. 07/13/17   Cuthriell, Delorise RoyalsJonathan D, PA-C     Allergies Penicillins  Family History  Problem Relation Age of Onset  . Hypertension Mother   . Breast cancer Neg Hx     Social History Social History   Tobacco Use  . Smoking status: Current Every Day Smoker    Packs/day: 1.00    Years: 30.00    Pack years: 30.00    Types: Cigarettes  . Smokeless tobacco: Never Used  Substance Use Topics  . Alcohol use: No  . Drug use: No     Review of Systems  Constitutional: No fever/chills Eyes: No visual changes.  ENT: Mild nasal congestion and sore throat Cardiovascular: no chest pain. Respiratory: Positive for cough.  Shortness of breath after coughing Gastrointestinal: No abdominal pain.  No nausea, no vomiting.  No diarrhea.  No constipation. Musculoskeletal: Negative for musculoskeletal pain. Skin: Negative for rash, abrasions, lacerations, ecchymosis. Neurological: Negative for headaches, focal weakness or numbness. 10-point ROS otherwise negative.  ____________________________________________   PHYSICAL EXAM:  VITAL SIGNS: ED Triage Vitals  Enc Vitals Group     BP 07/13/17 1806 (!) 144/84     Pulse Rate 07/13/17 1806 95     Resp 07/13/17 1806 20     Temp 07/13/17 1806 97.8 F (36.6 C)     Temp Source 07/13/17 1806 Oral     SpO2 07/13/17 1806 96 %     Weight 07/13/17 1807 120 lb (54.4 kg)     Height --      Head Circumference --      Peak Flow --  Pain Score --      Pain Loc --      Pain Edu? --      Excl. in GC? --      Constitutional: Alert and oriented. Well appearing and in no acute distress. Eyes: Conjunctivae are normal. PERRL. EOMI. Head: Atraumatic. ENT:      Ears: EACs and TMs unremarkable bilaterally.      Nose: Minimal clear congestion/rhinnorhea.      Mouth/Throat: Mucous membranes are moist.  Oropharynx is slightly erythematous but nonedematous.  Uvula is midline. Neck: No stridor.  Neck is supple full range of motion Hematological/Lymphatic/Immunilogical: No cervical  lymphadenopathy. Cardiovascular: Normal rate, regular rhythm. Normal S1 and S2.  Good peripheral circulation. Respiratory: Normal respiratory effort without tachypnea or retractions. Lungs with a few scattered expiratory wheezes.  No rales or rhonchi.  No crackles.Peri [place]. Good air entry to the bases with no decreased or absent breath sounds. Musculoskeletal: Full range of motion to all extremities. No gross deformities appreciated. Neurologic:  Normal speech and language. No gross focal neurologic deficits are appreciated.  Skin:  Skin is warm, dry and intact. No rash noted. Psychiatric: Mood and affect are normal. Speech and behavior are normal. Patient exhibits appropriate insight and judgement.   ____________________________________________   LABS (all labs ordered are listed, but only abnormal results are displayed)  Labs Reviewed - No data to display ____________________________________________  EKG   ____________________________________________  RADIOLOGY Festus BarrenI, Jonathan D Cuthriell, personally viewed and evaluated these images (plain radiographs) as part of my medical decision making, as well as reviewing the written report by the radiologist.  Dg Chest 2 View  Result Date: 07/13/2017 CLINICAL DATA:  Cough 1 week EXAM: CHEST  2 VIEW COMPARISON:  03/20/2010 FINDINGS: COPD with pulmonary hyperinflation. Negative for pneumonia. Negative for heart failure or effusion or mass lesion. IMPRESSION: COPD without acute abnormality. Electronically Signed   By: Marlan Palauharles  Clark M.D.   On: 07/13/2017 19:12    ____________________________________________    PROCEDURES  Procedure(s) performed:    Procedures    Medications  methylPREDNISolone sodium succinate (SOLU-MEDROL) 125 mg/2 mL injection 125 mg (125 mg Intramuscular Given 07/13/17 1845)  albuterol (PROVENTIL) (2.5 MG/3ML) 0.083% nebulizer solution 2.5 mg (2.5 mg Nebulization Given 07/13/17 1845)      ____________________________________________   INITIAL IMPRESSION / ASSESSMENT AND PLAN / ED COURSE  Pertinent labs & imaging results that were available during my care of the patient were reviewed by me and considered in my medical decision making (see chart for details).  Review of the [place] CSRS was performed in accordance of the [facility] prior to dispensing any controlled drugs.     Patient's diagnosis is consistent with otitis.  Differential included pneumonia versus viral URI versus bronchitis.  Chest x-ray reveals no consolidation consistent with pneumonia.. Patient will be discharged home with prescriptions for albuterol, prednisone, bromfed cough syrup. Patient is to follow up with primary care as needed or otherwise directed. Patient is given ED precautions to return to the ED for any worsening or new symptoms.     ____________________________________________  FINAL CLINICAL IMPRESSION(S) / ED DIAGNOSES  Final diagnoses:  Acute bronchitis, unspecified organism      NEW MEDICATIONS STARTED DURING THIS VISIT:  ED Discharge Orders        Ordered    albuterol (PROVENTIL HFA;VENTOLIN HFA) 108 (90 Base) MCG/ACT inhaler  Every 4 hours PRN     07/13/17 1935    predniSONE (DELTASONE) 50 MG tablet  Daily with breakfast  07/13/17 1935    brompheniramine-pseudoephedrine-DM 30-2-10 MG/5ML syrup  4 times daily PRN     07/13/17 1935          This chart was dictated using voice recognition software/Dragon. Despite best efforts to proofread, errors can occur which can change the meaning. Any change was purely unintentional.    Racheal Patches, PA-C 07/13/17 Rockne Coons, MD 07/14/17 [phone]

## 2018-12-22 ENCOUNTER — Ambulatory Visit: Payer: Self-pay

## 2019-07-01 ENCOUNTER — Other Ambulatory Visit: Payer: Self-pay

## 2019-07-01 DIAGNOSIS — Z20822 Contact with and (suspected) exposure to covid-19: Secondary | ICD-10-CM

## 2019-07-03 LAB — NOVEL CORONAVIRUS, NAA: SARS-CoV-2, NAA: NOT DETECTED

## 2019-12-06 ENCOUNTER — Other Ambulatory Visit: Payer: Self-pay | Admitting: Family Medicine

## 2019-12-20 ENCOUNTER — Other Ambulatory Visit: Payer: Self-pay

## 2019-12-20 ENCOUNTER — Ambulatory Visit: Payer: Self-pay | Attending: Oncology | Admitting: *Deleted

## 2019-12-20 ENCOUNTER — Ambulatory Visit
Admission: RE | Admit: 2019-12-20 | Discharge: 2019-12-20 | Disposition: A | Discharge status: Home / Self Care | Payer: Self-pay | Source: Ambulatory Visit | Attending: Oncology | Admitting: Oncology

## 2019-12-20 ENCOUNTER — Encounter: Payer: Self-pay | Admitting: *Deleted

## 2019-12-20 VITALS — BP 121/57 | HR 79 | Temp 97.8°F | Ht 65.0 in | Wt 121.0 lb

## 2019-12-20 DIAGNOSIS — Z0000 Encounter for general adult medical examination without abnormal findings: Secondary | ICD-10-CM | POA: Insufficient documentation

## 2019-12-20 NOTE — Progress Notes (Signed)
  Subjective:     Patient ID: Michele Bernard, female   DOB: 20-Dec-1965, 54 y.o.   MRN: [id number]  HPI   Review of Systems     Objective:   Physical Exam Chest:     Breasts:        Right: No swelling, bleeding, inverted nipple, mass, nipple discharge, skin change or tenderness.        Left: No swelling, bleeding, inverted nipple, mass, nipple discharge, skin change or tenderness.  Lymphadenopathy:     Upper Body:     Right upper body: No supraclavicular or axillary adenopathy.     Left upper body: No supraclavicular or axillary adenopathy.        Assessment:     54 year old White female returns to [facility] for annual screening.  Clinical breast exam unremarkable.  Taught self breast awareness.  Last pap on 03/12/16 was negative / negative.  Next pap due in 2022.  Patient has been screened for eligibility.  She does not have any insurance, Medicare or Medicaid.  She also meets financial eligibility.   Risk Assessment    Risk Scores      12/20/2019   Last edited by: Scarlett Presto, RN   5-year risk: 0.8 %   Lifetime risk: 6 %             Plan:     Screening mammogram ordered.  Will follow up per [facility] protocol.

## 2019-12-22 ENCOUNTER — Encounter: Payer: Self-pay | Admitting: *Deleted

## 2019-12-22 NOTE — Progress Notes (Signed)
Letter mailed from the [facility] to inform patient of her normal mammogram results.  Patient is to follow-up with annual screening in one year. 

## 2021-03-28 ENCOUNTER — Inpatient Hospital Stay: Payer: Self-pay | Admitting: Anesthesiology

## 2021-03-28 ENCOUNTER — Other Ambulatory Visit: Payer: Self-pay

## 2021-03-28 ENCOUNTER — Emergency Department: Payer: Self-pay

## 2021-03-28 ENCOUNTER — Inpatient Hospital Stay: Payer: Self-pay

## 2021-03-28 ENCOUNTER — Encounter: Payer: Self-pay | Admitting: Family Medicine

## 2021-03-28 ENCOUNTER — Inpatient Hospital Stay
Admission: EM | Admit: 2021-03-28 | Discharge: 2021-03-31 | DRG: 481 | Disposition: A | Discharge status: Home / Self Care | Payer: Self-pay | Attending: Internal Medicine | Admitting: Internal Medicine

## 2021-03-28 ENCOUNTER — Encounter
Admission: EM | Disposition: A | Discharge status: Home / Self Care | Payer: Self-pay | Source: Home / Self Care | Attending: Internal Medicine

## 2021-03-28 DIAGNOSIS — Z881 Allergy status to other antibiotic agents status: Secondary | ICD-10-CM

## 2021-03-28 DIAGNOSIS — I10 Essential (primary) hypertension: Secondary | ICD-10-CM

## 2021-03-28 DIAGNOSIS — W1830XA Fall on same level, unspecified, initial encounter: Secondary | ICD-10-CM | POA: Diagnosis present

## 2021-03-28 DIAGNOSIS — Z8249 Family history of ischemic heart disease and other diseases of the circulatory system: Secondary | ICD-10-CM

## 2021-03-28 DIAGNOSIS — D62 Acute posthemorrhagic anemia: Secondary | ICD-10-CM

## 2021-03-28 DIAGNOSIS — Y93K1 Activity, walking an animal: Secondary | ICD-10-CM

## 2021-03-28 DIAGNOSIS — W19XXXA Unspecified fall, initial encounter: Secondary | ICD-10-CM

## 2021-03-28 DIAGNOSIS — F17210 Nicotine dependence, cigarettes, uncomplicated: Secondary | ICD-10-CM | POA: Diagnosis present

## 2021-03-28 DIAGNOSIS — Z791 Long term (current) use of non-steroidal anti-inflammatories (NSAID): Secondary | ICD-10-CM

## 2021-03-28 DIAGNOSIS — Z20822 Contact with and (suspected) exposure to covid-19: Secondary | ICD-10-CM | POA: Diagnosis present

## 2021-03-28 DIAGNOSIS — E871 Hypo-osmolality and hyponatremia: Secondary | ICD-10-CM | POA: Diagnosis present

## 2021-03-28 DIAGNOSIS — S72141A Displaced intertrochanteric fracture of right femur, initial encounter for closed fracture: Principal | ICD-10-CM

## 2021-03-28 DIAGNOSIS — E781 Pure hyperglyceridemia: Secondary | ICD-10-CM

## 2021-03-28 DIAGNOSIS — S72001A Fracture of unspecified part of neck of right femur, initial encounter for closed fracture: Secondary | ICD-10-CM

## 2021-03-28 DIAGNOSIS — Z880 Allergy status to penicillin: Secondary | ICD-10-CM

## 2021-03-28 DIAGNOSIS — E785 Hyperlipidemia, unspecified: Secondary | ICD-10-CM | POA: Diagnosis present

## 2021-03-28 DIAGNOSIS — R710 Precipitous drop in hematocrit: Secondary | ICD-10-CM

## 2021-03-28 DIAGNOSIS — Z419 Encounter for procedure for purposes other than remedying health state, unspecified: Secondary | ICD-10-CM

## 2021-03-28 DIAGNOSIS — K219 Gastro-esophageal reflux disease without esophagitis: Secondary | ICD-10-CM

## 2021-03-28 DIAGNOSIS — E876 Hypokalemia: Secondary | ICD-10-CM

## 2021-03-28 DIAGNOSIS — Z79899 Other long term (current) drug therapy: Secondary | ICD-10-CM

## 2021-03-28 HISTORY — PX: INTRAMEDULLARY (IM) NAIL INTERTROCHANTERIC: SHX5875

## 2021-03-28 LAB — APTT: aPTT: 27 seconds (ref 24–36)

## 2021-03-28 LAB — CBC
HCT: 35.6 % — ABNORMAL LOW (ref 36.0–46.0)
Hemoglobin: 12.5 g/dL (ref 12.0–15.0)
MCH: 32.6 pg (ref 26.0–34.0)
MCHC: 35.1 g/dL (ref 30.0–36.0)
MCV: 92.7 fL (ref 80.0–100.0)
Platelets: 253 10*3/uL (ref 150–400)
RBC: 3.84 MIL/uL — ABNORMAL LOW (ref 3.87–5.11)
RDW: 12.2 % (ref 11.5–15.5)
WBC: 11.1 10*3/uL — ABNORMAL HIGH (ref 4.0–10.5)
nRBC: 0 % (ref 0.0–0.2)

## 2021-03-28 LAB — BASIC METABOLIC PANEL
Anion gap: 9 (ref 5–15)
Anion gap: 7 (ref 5–15)
BUN: 12 mg/dL (ref 6–20)
BUN: 9 mg/dL (ref 6–20)
CO2: 24 mmol/L (ref 22–32)
CO2: 25 mmol/L (ref 22–32)
Calcium: 8.5 mg/dL — ABNORMAL LOW (ref 8.9–10.3)
Calcium: 8.4 mg/dL — ABNORMAL LOW (ref 8.9–10.3)
Chloride: 101 mmol/L (ref 98–111)
Chloride: 102 mmol/L (ref 98–111)
Creatinine, Ser: 0.61 mg/dL (ref 0.44–1.00)
Creatinine, Ser: 0.53 mg/dL (ref 0.44–1.00)
GFR, Estimated: >60 mL/min (ref >60)
GFR, Estimated: >60 mL/min (ref >60)
Glucose, Bld: 112 mg/dL — ABNORMAL HIGH (ref 70–99)
Glucose, Bld: 139 mg/dL — ABNORMAL HIGH (ref 70–99)
Potassium: 2.9 mmol/L — ABNORMAL LOW (ref 3.5–5.1)
Potassium: 4.2 mmol/L (ref 3.5–5.1)
Sodium: 134 mmol/L — ABNORMAL LOW (ref 135–145)
Sodium: 134 mmol/L — ABNORMAL LOW (ref 135–145)

## 2021-03-28 LAB — HIV ANTIBODY (ROUTINE TESTING W REFLEX): HIV Screen 4th Generation wRfx: NONREACTIVE

## 2021-03-28 LAB — CBC WITH DIFFERENTIAL/PLATELET
Abs Immature Granulocytes: 0.03 10*3/uL (ref 0.00–0.07)
Basophils Absolute: 0 10*3/uL (ref 0.0–0.1)
Basophils Relative: 0 %
Eosinophils Absolute: 0.1 10*3/uL (ref 0.0–0.5)
Eosinophils Relative: 2 %
HCT: 34.4 % — ABNORMAL LOW (ref 36.0–46.0)
Hemoglobin: 11.9 g/dL — ABNORMAL LOW (ref 12.0–15.0)
Immature Granulocytes: 0 %
Lymphocytes Relative: 32 %
Lymphs Abs: 2.7 10*3/uL (ref 0.7–4.0)
MCH: 32.5 pg (ref 26.0–34.0)
MCHC: 34.6 g/dL (ref 30.0–36.0)
MCV: 94 fL (ref 80.0–100.0)
Monocytes Absolute: 0.6 10*3/uL (ref 0.1–1.0)
Monocytes Relative: 8 %
Neutro Abs: 4.9 10*3/uL (ref 1.7–7.7)
Neutrophils Relative %: 58 %
Platelets: 271 10*3/uL (ref 150–400)
RBC: 3.66 MIL/uL — ABNORMAL LOW (ref 3.87–5.11)
RDW: 12.1 % (ref 11.5–15.5)
WBC: 8.5 10*3/uL (ref 4.0–10.5)
nRBC: 0 % (ref 0.0–0.2)

## 2021-03-28 LAB — TYPE AND SCREEN
ABO/RH(D): A POS
Antibody Screen: NEGATIVE

## 2021-03-28 LAB — RESP PANEL BY RT-PCR (FLU A&B, COVID) ARPGX2
Influenza A by PCR: NEGATIVE
Influenza B by PCR: NEGATIVE
SARS Coronavirus 2 by RT PCR: NEGATIVE

## 2021-03-28 LAB — PROTIME-INR
INR: 1 (ref 0.8–1.2)
Prothrombin Time: 12.9 seconds (ref 11.4–15.2)

## 2021-03-28 LAB — MAGNESIUM: Magnesium: 1.6 mg/dL — ABNORMAL LOW (ref 1.7–2.4)

## 2021-03-28 SURGERY — FIXATION, FRACTURE, INTERTROCHANTERIC, WITH INTRAMEDULLARY ROD
Anesthesia: Spinal | Laterality: Right

## 2021-03-28 MED ORDER — METHOCARBAMOL 500 MG PO TABS
500.0000 mg | ORAL_TABLET | Freq: Four times a day (QID) | ORAL | Status: DC | PRN
  Administered 2021-03-29 – 2021-03-31 (×4): 500 mg via ORAL
  Filled 2021-03-28 (×4): qty 1

## 2021-03-28 MED ORDER — KETAMINE HCL 10 MG/ML IJ SOLN
INTRAMUSCULAR | Status: DC | PRN
  Administered 2021-03-28 (×2): 20 mg via INTRAVENOUS
  Administered 2021-03-28: 30 mg via INTRAVENOUS

## 2021-03-28 MED ORDER — CHLORHEXIDINE GLUCONATE CLOTH 2 % EX PADS
6.0000 | MEDICATED_PAD | Freq: Every day | CUTANEOUS | Status: DC
  Administered 2021-03-31: 6 via TOPICAL

## 2021-03-28 MED ORDER — METOCLOPRAMIDE HCL 10 MG PO TABS: 5.0000 mg | ORAL_TABLET | Freq: Three times a day (TID) | ORAL | Status: DC | PRN

## 2021-03-28 MED ORDER — OXYCODONE HCL 5 MG/5ML PO SOLN
5.0000 mg | Freq: Once | ORAL | Status: DC | PRN
Start: 2021-03-28 — End: 2021-03-28

## 2021-03-28 MED ORDER — CEFAZOLIN SODIUM-DEXTROSE 1-4 GM/50ML-% IV SOLN
1.0000 g | Freq: Four times a day (QID) | INTRAVENOUS | Status: DC
  Filled 2021-03-28 (×3): qty 50

## 2021-03-28 MED ORDER — ONDANSETRON HCL 4 MG/2ML IJ SOLN
4.0000 mg | Freq: Once | INTRAMUSCULAR | Status: AC
  Administered 2021-03-28: 4 mg via INTRAVENOUS
  Filled 2021-03-28: qty 2

## 2021-03-28 MED ORDER — LACTATED RINGERS IV SOLN: INTRAVENOUS | Status: DC | PRN

## 2021-03-28 MED ORDER — SENNOSIDES-DOCUSATE SODIUM 8.6-50 MG PO TABS: 1.0000 | ORAL_TABLET | Freq: Every evening | ORAL | Status: DC | PRN

## 2021-03-28 MED ORDER — ONDANSETRON HCL 4 MG/2ML IJ SOLN: 4.0000 mg | Freq: Four times a day (QID) | INTRAMUSCULAR | Status: DC | PRN

## 2021-03-28 MED ORDER — ONDANSETRON HCL 4 MG PO TABS: 4.0000 mg | ORAL_TABLET | Freq: Four times a day (QID) | ORAL | Status: DC | PRN

## 2021-03-28 MED ORDER — CEFAZOLIN SODIUM-DEXTROSE 1-4 GM/50ML-% IV SOLN
INTRAVENOUS | Status: AC
  Filled 2021-03-28: qty 50

## 2021-03-28 MED ORDER — ACETAMINOPHEN 650 MG RE SUPP: 650.0000 mg | Freq: Four times a day (QID) | RECTAL | Status: DC | PRN

## 2021-03-28 MED ORDER — KETOROLAC TROMETHAMINE 30 MG/ML IJ SOLN
INTRAMUSCULAR | Status: DC | PRN
  Administered 2021-03-28: 15 mg via INTRAVENOUS

## 2021-03-28 MED ORDER — PANTOPRAZOLE SODIUM 40 MG PO TBEC: 40.0000 mg | DELAYED_RELEASE_TABLET | Freq: Every day | ORAL | Status: DC

## 2021-03-28 MED ORDER — TRAMADOL HCL 50 MG PO TABS
50.0000 mg | ORAL_TABLET | Freq: Four times a day (QID) | ORAL | Status: DC | PRN
Start: 2021-03-28 — End: 2021-03-31

## 2021-03-28 MED ORDER — KETAMINE HCL 50 MG/5ML IJ SOSY
PREFILLED_SYRINGE | INTRAMUSCULAR | Status: AC
  Filled 2021-03-28: qty 5

## 2021-03-28 MED ORDER — OXYCODONE HCL 5 MG PO TABS: 2.5000 mg | ORAL_TABLET | ORAL | Status: DC | PRN

## 2021-03-28 MED ORDER — NEOMYCIN-POLYMYXIN B GU 40-200000 IR SOLN
Status: AC
  Filled 2021-03-28: qty 4

## 2021-03-28 MED ORDER — METOCLOPRAMIDE HCL 5 MG/ML IJ SOLN: 5.0000 mg | Freq: Three times a day (TID) | INTRAMUSCULAR | Status: DC | PRN

## 2021-03-28 MED ORDER — OXYCODONE HCL 5 MG PO TABS
5.0000 mg | ORAL_TABLET | ORAL | Status: DC | PRN
  Administered 2021-03-28 – 2021-03-30 (×10): 10 mg via ORAL
  Administered 2021-03-30: 5 mg via ORAL
  Administered 2021-03-31 (×3): 10 mg via ORAL
  Filled 2021-03-28 (×14): qty 2

## 2021-03-28 MED ORDER — OMEGA-3-ACID ETHYL ESTERS 1 G PO CAPS
1.0000 g | ORAL_CAPSULE | Freq: Two times a day (BID) | ORAL | Status: DC
  Filled 2021-03-28 (×2): qty 1

## 2021-03-28 MED ORDER — MIDAZOLAM HCL 2 MG/2ML IJ SOLN
INTRAMUSCULAR | Status: AC
  Filled 2021-03-28: qty 2

## 2021-03-28 MED ORDER — POTASSIUM CHLORIDE 10 MEQ/100ML IV SOLN
10.0000 meq | INTRAVENOUS | Status: AC
  Administered 2021-03-28 (×6): 10 meq via INTRAVENOUS
  Filled 2021-03-28: qty 100

## 2021-03-28 MED ORDER — SODIUM CHLORIDE 0.9 % IV SOLN: INTRAVENOUS | Status: DC

## 2021-03-28 MED ORDER — FLEET ENEMA 7-19 GM/118ML RE ENEM: 1.0000 | ENEMA | Freq: Once | RECTAL | Status: DC | PRN

## 2021-03-28 MED ORDER — CEFAZOLIN SODIUM 1 G IJ SOLR: 1000.0000 mg | Freq: Once | INTRAMUSCULAR | Status: DC

## 2021-03-28 MED ORDER — CEFAZOLIN SODIUM-DEXTROSE 1-4 GM/50ML-% IV SOLN
1.0000 g | Freq: Once | INTRAVENOUS | Status: AC
  Administered 2021-03-28: 1 g via INTRAVENOUS

## 2021-03-28 MED ORDER — DOCUSATE SODIUM 100 MG PO CAPS
100.0000 mg | ORAL_CAPSULE | Freq: Two times a day (BID) | ORAL | Status: DC
  Administered 2021-03-28 – 2021-03-31 (×6): 100 mg via ORAL
  Filled 2021-03-28 (×6): qty 1

## 2021-03-28 MED ORDER — ACETAMINOPHEN 325 MG PO TABS: 650.0000 mg | ORAL_TABLET | Freq: Four times a day (QID) | ORAL | Status: DC | PRN

## 2021-03-28 MED ORDER — FENTANYL CITRATE (PF) 100 MCG/2ML IJ SOLN
INTRAMUSCULAR | Status: DC | PRN
  Administered 2021-03-28 (×2): 50 ug via INTRAVENOUS

## 2021-03-28 MED ORDER — SODIUM CHLORIDE 0.9 % IR SOLN
Status: DC | PRN
  Administered 2021-03-28: 1004 mL

## 2021-03-28 MED ORDER — ONDANSETRON HCL 4 MG/2ML IJ SOLN
INTRAMUSCULAR | Status: DC | PRN
  Administered 2021-03-28: 4 mg via INTRAVENOUS

## 2021-03-28 MED ORDER — MIDAZOLAM HCL 5 MG/5ML IJ SOLN
INTRAMUSCULAR | Status: DC | PRN
  Administered 2021-03-28: 2 mg via INTRAVENOUS

## 2021-03-28 MED ORDER — BUPIVACAINE LIPOSOME 1.3 % IJ SUSP
INTRAMUSCULAR | Status: AC
  Filled 2021-03-28: qty 20

## 2021-03-28 MED ORDER — KETOROLAC TROMETHAMINE 15 MG/ML IJ SOLN
15.0000 mg | Freq: Four times a day (QID) | INTRAMUSCULAR | Status: AC
  Administered 2021-03-28 – 2021-03-29 (×4): 15 mg via INTRAVENOUS
  Filled 2021-03-28 (×4): qty 1

## 2021-03-28 MED ORDER — PHENYLEPHRINE HCL (PRESSORS) 10 MG/ML IV SOLN
INTRAVENOUS | Status: AC
  Filled 2021-03-28: qty 1

## 2021-03-28 MED ORDER — SUGAMMADEX SODIUM 200 MG/2ML IV SOLN
INTRAVENOUS | Status: DC | PRN
  Administered 2021-03-28: 200 mg via INTRAVENOUS

## 2021-03-28 MED ORDER — METHOCARBAMOL 1000 MG/10ML IJ SOLN
500.0000 mg | Freq: Four times a day (QID) | INTRAVENOUS | Status: DC | PRN
  Filled 2021-03-28: qty 5

## 2021-03-28 MED ORDER — PROPOFOL 10 MG/ML IV BOLUS
INTRAVENOUS | Status: DC | PRN
  Administered 2021-03-28: 100 mg via INTRAVENOUS

## 2021-03-28 MED ORDER — OXYCODONE-ACETAMINOPHEN 5-325 MG PO TABS
1.0000 | ORAL_TABLET | ORAL | Status: DC | PRN
  Administered 2021-03-28: 1 via ORAL
  Administered 2021-03-28: 2 via ORAL
  Filled 2021-03-28: qty 1
  Filled 2021-03-28: qty 2

## 2021-03-28 MED ORDER — FENOFIBRATE 160 MG PO TABS
160.0000 mg | ORAL_TABLET | Freq: Every day | ORAL | Status: DC
  Administered 2021-03-29 – 2021-03-31 (×3): 160 mg via ORAL
  Filled 2021-03-28 (×5): qty 1

## 2021-03-28 MED ORDER — BUPIVACAINE HCL (PF) 0.5 % IJ SOLN
INTRAMUSCULAR | Status: AC
  Filled 2021-03-28: qty 30

## 2021-03-28 MED ORDER — ONDANSETRON HCL 4 MG/2ML IJ SOLN
4.0000 mg | Freq: Four times a day (QID) | INTRAMUSCULAR | Status: DC | PRN
  Administered 2021-03-28: 4 mg via INTRAVENOUS
  Filled 2021-03-28: qty 2

## 2021-03-28 MED ORDER — OXYCODONE HCL 5 MG PO TABS
5.0000 mg | ORAL_TABLET | Freq: Once | ORAL | Status: DC | PRN
Start: 2021-03-28 — End: 2021-03-28

## 2021-03-28 MED ORDER — ROCURONIUM BROMIDE 100 MG/10ML IV SOLN
INTRAVENOUS | Status: DC | PRN
  Administered 2021-03-28: 40 mg via INTRAVENOUS

## 2021-03-28 MED ORDER — BISACODYL 10 MG RE SUPP: 10.0000 mg | Freq: Every day | RECTAL | Status: DC | PRN

## 2021-03-28 MED ORDER — ACETAMINOPHEN 500 MG PO TABS
1000.0000 mg | ORAL_TABLET | Freq: Three times a day (TID) | ORAL | Status: DC
  Administered 2021-03-28 – 2021-03-31 (×7): 1000 mg via ORAL
  Filled 2021-03-28 (×7): qty 2

## 2021-03-28 MED ORDER — ENOXAPARIN SODIUM 40 MG/0.4ML IJ SOSY
40.0000 mg | PREFILLED_SYRINGE | INTRAMUSCULAR | Status: DC
  Administered 2021-03-29 – 2021-03-31 (×3): 40 mg via SUBCUTANEOUS
  Filled 2021-03-28 (×4): qty 0.4

## 2021-03-28 MED ORDER — MORPHINE SULFATE (PF) 4 MG/ML IV SOLN
4.0000 mg | INTRAVENOUS | Status: AC | PRN
  Administered 2021-03-28 (×2): 4 mg via INTRAVENOUS
  Filled 2021-03-28 (×2): qty 1

## 2021-03-28 MED ORDER — MAGNESIUM SULFATE 2 GM/50ML IV SOLN
2.0000 g | Freq: Once | INTRAVENOUS | Status: AC
  Administered 2021-03-28: 2 g via INTRAVENOUS
  Filled 2021-03-28: qty 50

## 2021-03-28 MED ORDER — BUPIVACAINE LIPOSOME 1.3 % IJ SUSP
INTRAMUSCULAR | Status: DC | PRN
  Administered 2021-03-28: 50 mL

## 2021-03-28 MED ORDER — ALBUTEROL SULFATE (2.5 MG/3ML) 0.083% IN NEBU: 2.5000 mg | INHALATION_SOLUTION | RESPIRATORY_TRACT | Status: DC | PRN

## 2021-03-28 MED ORDER — MORPHINE SULFATE (PF) 2 MG/ML IV SOLN
2.0000 mg | INTRAVENOUS | Status: DC | PRN
  Administered 2021-03-28: 2 mg via INTRAVENOUS
  Filled 2021-03-28: qty 1

## 2021-03-28 MED ORDER — SODIUM CHLORIDE 0.9 % IV SOLN
INTRAVENOUS | Status: DC | PRN
  Administered 2021-03-28: 10 ug/min via INTRAVENOUS

## 2021-03-28 MED ORDER — HYDROMORPHONE HCL 1 MG/ML IJ SOLN: 0.2000 mg | INTRAMUSCULAR | Status: DC | PRN

## 2021-03-28 MED ORDER — DIPHENHYDRAMINE HCL 50 MG/ML IJ SOLN
INTRAMUSCULAR | Status: DC | PRN
  Administered 2021-03-28: 6.25 mg via INTRAVENOUS

## 2021-03-28 MED ORDER — TRAZODONE HCL 50 MG PO TABS: 25.0000 mg | ORAL_TABLET | Freq: Every evening | ORAL | Status: DC | PRN

## 2021-03-28 MED ORDER — FENTANYL CITRATE (PF) 100 MCG/2ML IJ SOLN: 25.0000 ug | INTRAMUSCULAR | Status: DC | PRN

## 2021-03-28 MED ORDER — PHENYLEPHRINE HCL (PRESSORS) 10 MG/ML IV SOLN
INTRAVENOUS | Status: DC | PRN
  Administered 2021-03-28 (×3): 100 ug via INTRAVENOUS

## 2021-03-28 MED ORDER — FENTANYL CITRATE (PF) 100 MCG/2ML IJ SOLN
INTRAMUSCULAR | Status: AC
  Filled 2021-03-28: qty 2

## 2021-03-28 MED ORDER — DEXAMETHASONE SODIUM PHOSPHATE 10 MG/ML IJ SOLN
INTRAMUSCULAR | Status: DC | PRN
  Administered 2021-03-28: 4 mg via INTRAVENOUS

## 2021-03-28 MED ORDER — MAGNESIUM HYDROXIDE 400 MG/5ML PO SUSP: 30.0000 mL | Freq: Every day | ORAL | Status: DC | PRN

## 2021-03-28 SURGICAL SUPPLY — 50 items
"PENCIL ELECTRO HAND CTR " (MISCELLANEOUS) ×1 IMPLANT
BIT DRILL INTERTAN LAG SCREW (BIT) ×1 IMPLANT
BIT DRILL LONG 4.0 (BIT) IMPLANT
BLADE SURG 15 STRL LF DISP TIS (BLADE) ×1 IMPLANT
BLADE SURG 15 STRL SS (BLADE) ×1
CHLORAPREP W/TINT 26 (MISCELLANEOUS) ×2 IMPLANT
DRAPE 3/4 80X56 (DRAPES) ×2 IMPLANT
DRAPE SURG 17X11 SM STRL (DRAPES) ×4 IMPLANT
DRAPE U-SHAPE 47X51 STRL (DRAPES) ×4 IMPLANT
DRILL BIT LONG 4.0 (BIT) ×2
DRSG OPSITE POSTOP 3X4 (GAUZE/BANDAGES/DRESSINGS) ×4 IMPLANT
DRSG OPSITE POSTOP 4X6 (GAUZE/BANDAGES/DRESSINGS) ×1 IMPLANT
ELECT REM PT RETURN 9FT ADLT (ELECTROSURGICAL) ×2
ELECTRODE REM PT RTRN 9FT ADLT (ELECTROSURGICAL) ×1 IMPLANT
GAUZE 4X4 16PLY ~~LOC~~+RFID DBL (SPONGE) ×1 IMPLANT
GLOVE SRG 8 PF TXTR STRL LF DI (GLOVE) ×1 IMPLANT
GLOVE SURG SYN 7.5  E (GLOVE) ×1
GLOVE SURG SYN 7.5 E (GLOVE) ×1 IMPLANT
GLOVE SURG SYN 7.5 PF PI (GLOVE) ×1 IMPLANT
GLOVE SURG UNDER POLY LF SZ8 (GLOVE) ×1
GOWN STRL REUS W/ TWL LRG LVL3 (GOWN DISPOSABLE) ×1 IMPLANT
GOWN STRL REUS W/ TWL XL LVL3 (GOWN DISPOSABLE) ×1 IMPLANT
GOWN STRL REUS W/TWL LRG LVL3 (GOWN DISPOSABLE) ×1
GOWN STRL REUS W/TWL XL LVL3 (GOWN DISPOSABLE) ×1
GUIDE PIN 3.2X343 (PIN) ×2
GUIDE PIN 3.2X343MM (PIN) ×2
KIT PATIENT CARE HANA TABLE (KITS) ×2 IMPLANT
KIT TURNOVER KIT A (KITS) ×2 IMPLANT
MANIFOLD NEPTUNE II (INSTRUMENTS) ×2 IMPLANT
MAT ABSORB  FLUID 56X50 GRAY (MISCELLANEOUS) ×1
MAT ABSORB FLUID 56X50 GRAY (MISCELLANEOUS) ×2 IMPLANT
NAIL TRIGEN INTERTAN 10X18CM (Nail) ×1 IMPLANT
NDL FILTER BLUNT 18X1 1/2 (NEEDLE) ×1 IMPLANT
NEEDLE FILTER BLUNT 18X 1/2SAF (NEEDLE) ×1
NEEDLE FILTER BLUNT 18X1 1/2 (NEEDLE) ×1 IMPLANT
NEEDLE HYPO 22GX1.5 SAFETY (NEEDLE) ×2 IMPLANT
NS IRRIG 1000ML POUR BTL (IV SOLUTION) ×2 IMPLANT
PACK HIP COMPR (MISCELLANEOUS) ×2 IMPLANT
PENCIL ELECTRO HAND CTR (MISCELLANEOUS) ×2 IMPLANT
PIN GUIDE 3.2X343MM (PIN) IMPLANT
SCREW LAG COMPR KIT 95/90 (Screw) ×1 IMPLANT
SCREW TRIGEN LOW PROF 5.0X30 (Screw) ×1 IMPLANT
SPONGE T-LAP 18X18 ~~LOC~~+RFID (SPONGE) ×3 IMPLANT
STAPLER SKIN PROX 35W (STAPLE) ×2 IMPLANT
SUT VIC AB 0 CT1 36 (SUTURE) ×1 IMPLANT
SUT VIC AB 2-0 CT2 27 (SUTURE) ×1 IMPLANT
SYR 10ML LL (SYRINGE) ×2 IMPLANT
SYR 30ML LL (SYRINGE) ×2 IMPLANT
TAPE CLOTH 3X10 WHT NS LF (GAUZE/BANDAGES/DRESSINGS) ×3 IMPLANT
WATER STERILE IRR 500ML POUR (IV SOLUTION) ×2 IMPLANT

## 2021-03-28 NOTE — ED Provider Notes (Signed)
[facility] Emergency Department Provider Note  ____________________________________________   Event Date/Time   First MD Initiated Contact with Patient 03/28/21 [phone]     (approximate)  I have reviewed the triage vital signs and the nursing notes.   HISTORY  Chief Complaint Fall    HPI Michele Bernard is a 55 y.o. female with no contributory past medical history who presents by EMS for evaluation of acute onset and severe sharp right hip pain.  She was walking her dog when another dog rushed at them.  She somehow got tangled up and fell to the pavement, landing on her right hip.  The leg is externally rotated and shortened upon arrival.  She said that she can feel her leg and has no numbness nor tingling even in the foot, but any amount of movement causes severe pain in the upper part of her right hip.  She has no pain anywhere else other than her right elbow which is a little bit sore.  She has some abrasions on her right arm but no open lacerations.  She did not strike her head and did not lose consciousness; she denies headache and neck pain.  She also denies chest pain or shortness of breath.  She has not been ill recently and denies fever, cough, nausea, vomiting, and abdominal pain.  The onset was acute and the episode was severe.     Past Medical History:  Diagnosis Date   GERD (gastroesophageal reflux disease)    Hypertension     Patient Active Problem List   Diagnosis Date Noted   Urethral caruncle 04/19/2016    Past Surgical History:  Procedure Laterality Date   BLADDER SURGERY     Bladder tact   BREAST BIOPSY Left 07/03/2016   path pending    Prior to Admission medications   Medication Sig Start Date End Date Taking? Authorizing Provider  celecoxib (CELEBREX) 200 MG capsule Take 200 mg by mouth 2 (two) times daily. 10/02/20  Yes [provider]  fenofibrate (TRICOR) 145 MG tablet Take 145 mg by mouth daily. 12/10/20  Yes  [provider]  hydrochlorothiazide (HYDRODIURIL) 25 MG tablet Take 25 mg by mouth daily. 10/02/20  Yes [provider]  Omega-3 Fatty Acids (FISH OIL) 1000 MG CAPS Take 1 capsule by mouth 2 (two) times daily. 03/25/21  Yes [provider]  omeprazole (PRILOSEC) 40 MG capsule Take 40 mg by mouth daily.   Yes [provider]  albuterol (PROVENTIL HFA;VENTOLIN HFA) 108 (90 Base) MCG/ACT inhaler Inhale 2 puffs into the lungs every 4 (four) hours as needed for wheezing or shortness of breath. 07/13/17   Cuthriell, Delorise RoyalsJonathan D, PA-C    Allergies Penicillins  Family History  Problem Relation Age of Onset   Hypertension Mother    Breast cancer Neg Hx     Social History Social History   Tobacco Use   Smoking status: Every Day    Packs/day: 1.00    Years: 30.00    Pack years: 30.00    Types: Cigarettes   Smokeless tobacco: Never  Substance Use Topics   Alcohol use: No   Drug use: No    Review of Systems Constitutional: No fever/chills Eyes: No visual changes. ENT: No sore throat. Cardiovascular: Denies chest pain. Respiratory: Denies shortness of breath. Gastrointestinal: No abdominal pain.  No nausea, no vomiting.  No diarrhea.  No constipation. Genitourinary: Negative for dysuria. Musculoskeletal: Severe pain in right hip Integumentary: Abrasions on right arm  Neurological: Negative for headaches, focal weakness or numbness.   ____________________________________________   PHYSICAL EXAM:  VITAL SIGNS: ED Triage Vitals  Enc Vitals Group     BP 03/28/21 0048 109/72     Pulse Rate 03/28/21 0048 83     Resp 03/28/21 0048 19     Temp 03/28/21 0048 97.8 F (36.6 C)     Temp Source 03/28/21 0048 Oral     SpO2 03/28/21 0048 96 %     Weight 03/28/21 0049 54.4 kg (120 lb)     Height 03/28/21 0049 1.676 m (5\' 6" )     Head Circumference --      Peak Flow --      Pain Score 03/28/21 0049 8     Pain Loc --      Pain Edu? --      Excl. in  GC? --     Constitutional: Alert and oriented.  Obviously uncomfortable. Eyes: Conjunctivae are normal.  Head: Atraumatic. Nose: No congestion/rhinnorhea. Mouth/Throat: Patient is wearing a mask. Neck: No stridor.  No meningeal signs.   Cardiovascular: Normal rate, regular rhythm. Good peripheral circulation. Respiratory: Normal respiratory effort.  No retractions. Gastrointestinal: Soft and nontender. No distention.  Musculoskeletal: Right lower extremity is externally rotated and shortened.  She has a palpable deformity and the area of the proximal femur that is severely tender to palpation.  The leg is neurovascularly intact distally.  I am able to passively flex and extend the patient's elbow and wrist on the right arm without any reproducible pain or tenderness and there is no indication of a humeral nor forearm fracture. Neurologic:  Normal speech and language. No gross focal neurologic deficits are appreciated.  Skin:  Skin is warm, dry and intact except for a few superficial abrasions on the right arm around the elbow. Psychiatric: Mood and affect are normal. Speech and behavior are normal.  ____________________________________________   LABS (all labs ordered are listed, but only abnormal results are displayed)  Labs Reviewed  CBC WITH DIFFERENTIAL/PLATELET - Abnormal; Notable for the following components:      Result Value   RBC 3.66 (*)    Hemoglobin 11.9 (*)    HCT 34.4 (*)    All other components within normal limits  BASIC METABOLIC PANEL - Abnormal; Notable for the following components:   Sodium 134 (*)    Potassium 2.9 (*)    Glucose, Bld 112 (*)    Calcium 8.5 (*)    All other components within normal limits  RESP PANEL BY RT-PCR (FLU A&B, COVID) ARPGX2  APTT  PROTIME-INR  MAGNESIUM  TYPE AND SCREEN   ____________________________________________  EKG  ED ECG REPORT I, 05/28/21, the attending physician, personally viewed and interpreted this  ECG.  Date: 03/28/2021 EKG Time: 00: 47 Rate: 80 Rhythm: normal sinus rhythm QRS Axis: normal Intervals: normal ST/T Wave abnormalities: Some nonspecific changes but nothing consistent with acute ischemia Narrative Interpretation: no evidence of acute ischemia  ____________________________________________  RADIOLOGY I, 01, personally viewed and evaluated these images (plain radiographs) as part of my medical decision making, as well as reviewing the written report by the radiologist.  ED MD interpretation: Right intertrochanteric proximal femur fracture  Official radiology report(s): DG Chest 1 View  Result Date: 03/28/2021 CLINICAL DATA:  Status post fall. EXAM: CHEST  1 VIEW COMPARISON:  July 13, 2017 FINDINGS: Chronic appearing increased lung markings are seen with mild areas of atelectasis and/or infiltrate noted within the bilateral lung bases,  right greater than left. There is no evidence of a pleural effusion or pneumothorax. The heart size and mediastinal contours are within normal limits. The visualized skeletal structures are unremarkable. IMPRESSION: Chronic appearing increased lung markings with mild bibasilar atelectasis and/or infiltrate. Electronically Signed   By: Aram Candela M.D.   On: 03/28/2021 01:25   DG Hip Unilat W or Wo Pelvis 2-3 Views Right  Result Date: 03/28/2021 CLINICAL DATA:  Status post fall. EXAM: DG HIP (WITH OR WITHOUT PELVIS) 2-3V RIGHT COMPARISON:  None. FINDINGS: Acute, comminuted inter trochanteric fracture is seen involving the proximal right femur. There is no evidence of dislocation. Soft tissue structures are unremarkable. IMPRESSION: Acute intertrochanteric fracture of the proximal right femur. Electronically Signed   By: Aram Candela M.D.   On: 03/28/2021 01:24    ____________________________________________   PROCEDURES   Procedure(s) performed (including Critical  Care):  Procedures   ____________________________________________   INITIAL IMPRESSION / MDM / ASSESSMENT AND PLAN / ED COURSE  As part of my medical decision making, I reviewed the following data within the electronic MEDICAL RECORD NUMBER Nursing notes reviewed and incorporated, Labs reviewed , EKG interpreted , Old chart reviewed, Radiograph reviewed , Discussed with admitting physician (Dr. Arville Care), Discussed with orthopedic surgeon (Dr. Signa Kell), and Notes from prior ED visits   Differential diagnosis includes, but is not limited to, right femur fracture, pelvic fracture, dislocation.  Morphine 4 mg IV, Zofran 4 mg IV, normal saline at 100 mL/h, n.p.o. status, strongly suspect fracture.  X-rays pending and standard hip fracture lab work is pending.  No indication of intracranial or spinal injury.  No indication for additional imaging as there does not seem to be any other probable fractures as result of her fall.  Patient does not take anticoagulation.       Clinical Course as of 03/28/21 0247  Thu Mar 28, 2021  0137 DG Hip Unilat W or Wo Pelvis 2-3 Views Right I personally reviewed the patient's imaging and agree with the radiologist's interpretation that the patient has a right intertrochanteric hip fracture.  Chest x-ray is generally reassuring with chronic changes but no evidence of acute abnormality.  I discussed the case by phone with Dr. Signa Kell with orthopedics.  He agrees with the plan for hospitalist admission and he will operate likely later in the afternoon today.  I will update the patient and reassess her pain status. [CF]  0225 CBC WITH DIFFERENTIAL(!) Normal CBC [CF]  0230 Basic metabolic panel(!) Normal basic metabolic panel [CF]  0230 Consulting hospitalist for admission [CF]  0235 Potassium(!): 2.9 Mild hypokalemia, ordering potassium 10 meq IV x 6 doses since the patient should remain NPO [CF]  0247 Discussed case in person with Dr. Arville Care and he will admit.  [CF]    Clinical Course User Index [CF] Loleta Rose, MD     ____________________________________________  FINAL CLINICAL IMPRESSION(S) / ED DIAGNOSES  Final diagnoses:  Fall, initial encounter  Closed displaced intertrochanteric fracture of right femur, initial encounter (HCC)  Hypokalemia     MEDICATIONS GIVEN DURING THIS VISIT:  Medications  0.9 %  sodium chloride infusion ( Intravenous New Bag/Given 03/28/21 0208)  potassium chloride 10 mEq in 100 mL IVPB (has no administration in time range)  morphine 4 MG/ML injection 4 mg (4 mg Intravenous Given 03/28/21 0245)  ondansetron (ZOFRAN) injection 4 mg (4 mg Intravenous Given 03/28/21 0209)     ED Discharge Orders     None  Note:  This document was prepared using Dragon voice recognition software and may include unintentional dictation errors.   Loleta Rose, MD 03/28/21 [id number]

## 2021-03-28 NOTE — OR Nursing (Signed)
When moving the patient from the stretcher to the OR table, a heart shaped pendant and small jewel charm were discovered. Items were placed in a bag with a patient label and will be sent with the patient chart to PACU.

## 2021-03-28 NOTE — Progress Notes (Signed)
Full consult note and discussion with patient to follow.  Called by ED staff. Imaging reviewed.  - Plan for surgery (R hip nail) 03/28/21, likely afternoon.  - NPO until OR - Hold anticoagulation - Admit to Hospitalist team.

## 2021-03-28 NOTE — Consult Note (Signed)
ORTHOPAEDIC CONSULTATION  REQUESTING PHYSICIAN: Narda Bonds, MD  Chief Complaint:   R hip pain  History of Present Illness: Michele Bernard is a 55 y.o. female who had a fall yesterday after she was walking her dog that got attacked by a pit bull.  The patient noted immediate hip pain and inability to ambulate.  The patient ambulates unassisted at baseline.  The patient lives at home with her son and grandchildren.  She works as a Administrator at [employer].  Pain is worse with any sort of movement.  X-rays in the emergency department show a right intertrochanteric hip fracture.  She has a medical history significant for hypertension.  She does smoke daily.  No prior history of fracture.  Past Medical History:  Diagnosis Date   GERD (gastroesophageal reflux disease)    Hypertension    Past Surgical History:  Procedure Laterality Date   BLADDER SURGERY     Bladder tact   BREAST BIOPSY Left 07/03/2016   path pending   Social History   Socioeconomic History   Marital status: Single    Spouse name: Not on file   Number of children: Not on file   Years of education: Not on file   Highest education level: Not on file  Occupational History   Not on file  Tobacco Use   Smoking status: Every Day    Packs/day: 1.00    Years: 30.00    Pack years: 30.00    Types: Cigarettes   Smokeless tobacco: Never  Substance and Sexual Activity   Alcohol use: No   Drug use: No   Sexual activity: Yes    Birth control/protection: Post-menopausal  Other Topics Concern   Not on file  Social History Narrative   Not on file   Social Determinants of Health   Financial Resource Strain: Not on file  Food Insecurity: Not on file  Transportation Needs: Not on file  Physical Activity: Not on file  Stress: Not on file  Social Connections: Not on file   Family History  Problem Relation Age of Onset   Hypertension  Mother    Breast cancer Neg Hx    Allergies  Allergen Reactions   Penicillins Hives   Prior to Admission medications   Medication Sig Start Date End Date Taking? Authorizing Provider  celecoxib (CELEBREX) 200 MG capsule Take 200 mg by mouth 2 (two) times daily. 10/02/20  Yes [provider]  fenofibrate (TRICOR) 145 MG tablet Take 145 mg by mouth daily. 12/10/20  Yes [provider]  hydrochlorothiazide (HYDRODIURIL) 25 MG tablet Take 25 mg by mouth daily. 10/02/20  Yes [provider]  Omega-3 Fatty Acids (FISH OIL) 1000 MG CAPS Take 1 capsule by mouth 2 (two) times daily. 03/25/21  Yes [provider]  omeprazole (PRILOSEC) 40 MG capsule Take 40 mg by mouth daily.   Yes [provider]  albuterol (PROVENTIL HFA;VENTOLIN HFA) 108 (90 Base) MCG/ACT inhaler Inhale 2 puffs into the lungs every 4 (four) hours as needed for wheezing or shortness of breath. 07/13/17   Racheal Patches, PA-C   Recent Labs    03/28/21 0202 03/28/21 0637  WBC 8.5 11.1*  HGB 11.9* 12.5  HCT 34.4* 35.6*  PLT 271 253  K 2.9* 4.2  CL 101 102  CO2 24 25  BUN 12 9  CREATININE 0.61 0.53  GLUCOSE 112* 139*  CALCIUM 8.5* 8.4*  INR 1.0  --    DG  Chest 1 View  Result Date: 03/28/2021 CLINICAL DATA:  Status post fall. EXAM: CHEST  1 VIEW COMPARISON:  July 13, 2017 FINDINGS: Chronic appearing increased lung markings are seen with mild areas of atelectasis and/or infiltrate noted within the bilateral lung bases, right greater than left. There is no evidence of a pleural effusion or pneumothorax. The heart size and mediastinal contours are within normal limits. The visualized skeletal structures are unremarkable. IMPRESSION: Chronic appearing increased lung markings with mild bibasilar atelectasis and/or infiltrate. Electronically Signed   By: Aram Candela M.D.   On: 03/28/2021 01:25   DG Hip Unilat W or Wo Pelvis 2-3 Views Right  Result Date: 03/28/2021 CLINICAL  DATA:  Status post fall. EXAM: DG HIP (WITH OR WITHOUT PELVIS) 2-3V RIGHT COMPARISON:  None. FINDINGS: Acute, comminuted inter trochanteric fracture is seen involving the proximal right femur. There is no evidence of dislocation. Soft tissue structures are unremarkable. IMPRESSION: Acute intertrochanteric fracture of the proximal right femur. Electronically Signed   By: Aram Candela M.D.   On: 03/28/2021 01:24     Positive ROS: All other systems have been reviewed and were otherwise negative with the exception of those mentioned in the HPI and as above.  Physical Exam: BP (!) 146/70   Pulse 73   Temp 97.8 F (36.6 C) (Oral)   Resp 18   Ht 5\' 6"  (1.676 m)   Wt 54.4 kg   SpO2 93%   BMI 19.37 kg/m  General:  Alert, no acute distress Psychiatric:  Patient is competent for consent with normal mood and affect   Cardiovascular:  No pedal edema, regular rate and rhythm Respiratory:  No wheezing, non-labored breathing GI:  Abdomen is soft and non-tender Skin:  No lesions in the area of chief complaint, no erythema Neurologic:  Sensation intact distally, CN grossly intact Lymphatic:  No axillary or cervical lymphadenopathy  Orthopedic Exam:  RLE: 5/5 DF/PF/EHL SILT s/s/t/sp/dp distr Foot wwp +Log roll/axial load   X-rays:  As above: R intertrochanteric hip fracture  Assessment/Plan: Michele Bernard is a 55 y.o. female with a R intertrochanteric hip fracture   1. I discussed the various treatment options including both surgical and non-surgical management of the fracture with the patient. We discussed the high risk of perioperative complications due to co-morbidities. After discussion of risks, benefits, and alternatives to surgery, the patient was in agreement to proceed with surgery.  The goals of surgery would be to provide adequate pain relief and allow for mobilization. Plan for surgery is R hip cephalomedullary nailing today, 03/28/2021. 2. NPO until OR 3. Hold anticoagulation  in advance of OR      05/28/2021   03/28/2021 12:06 PM

## 2021-03-28 NOTE — Op Note (Signed)
DATE OF SURGERY: 03/28/2021  PREOPERATIVE DIAGNOSIS: Right intertrochanteric hip fracture  POSTOPERATIVE DIAGNOSIS: Right intertrochanteric hip fracture  PROCEDURE: Intramedullary nailing of Right femur with cephalomedullary device  SURGEON: Rosealee Albee, MD  ANESTHESIA: Gen  EBL: 50 cc  IVF: per anesthesia record  COMPONENTS:  [employer] Trigen Intertan Short Nail: 10x159mm; 12mm lag screw with 13mm compression screw; 5x 91mm distal cortical interlocking screw  INDICATIONS: Michele Bernard is a 55 y.o. female who sustained an intertrochanteric fracture after a fall. Risks and benefits of intramedullary nailing were explained to the patient and/or family . Risks include but are not limited to bleeding, infection, injury to tissues, nerves, vessels, nonunion/malunion, hardware failure, limb length discrepancy/hip rotation mismatch and risks of anesthesia. The patient and/or family understand these risks, have completed an informed consent, and wish to proceed.   PROCEDURE:  The patient was brought into the operating room. After administering anesthesia, the patient was placed in the supine position on the Hana table. The uninjured leg was placed in an extended position while the injured lower extremity was placed in longitudinal traction. The fracture was reduced using longitudinal traction and internal rotation. The adequacy of reduction was verified fluoroscopically in AP and lateral projections and found to be acceptable. The lateral aspect of the hip and thigh were prepped with ChloraPrep solution before being draped sterilely. Preoperative IV antibiotics were administered. A timeout was performed to verify the appropriate surgical site, patient, and procedure.    The greater trochanter was identified and an approximately 6 cm incision was made about 3 fingerbreadths above the tip of the greater trochanter. The incision was carried down through the subcutaneous tissues to expose the  gluteal fascia. This was split the length of the incision, providing access to the tip of the trochanter. Under fluoroscopic guidance, a guidewire was drilled through the tip of the trochanter into the proximal metaphysis to the level of the lesser trochanter. After verifying its position fluoroscopically in AP and lateral projections, it was overreamed with the opening reamer to the level of the lesser trochanter. The nail was selected and advanced to the appropriate depth as verified fluoroscopically.    The guide system for the lag screw was positioned and advanced through an approximately 5cm incision over the lateral aspect of the proximal femur. The guidewire was drilled up through the femoral nail and into the femoral neck to rest within 5 mm of subchondral bone. After verifying its position in the femoral neck and head in both AP and lateral projections, the guidewire was measured and appropriate sized lag screw was selected.  The channel for the compression screw was drilled and antirotation bar was placed.  Lag screw was drilled and placed in appropriate position.  Compression screw was then placed.  Appropriate compression was achieved.  The set screw was locked in place. Again, the adequacy of hardware position and fracture reduction was verified fluoroscopically in AP and lateral projections.   Attention was then turned to the distal interlocking screw in the diaphysis. Using a targeted assembly, a stab incision was made and hole was drilled through the nail. An interlocking screw was placed with excellent purchase.  Appropriate screw position was verified fluoroscopically in AP and lateral projections.   The wounds were irrigated thoroughly with sterile saline solution. Local anesthetic was injected into the wounds. Deep fascia was closed with 0-Vicryl. The subcutaneous tissues were closed using 2-0 Vicryl interrupted sutures. The skin was closed using staples. Sterile occlusive dressings were  applied to all wounds. The patient was then transferred to the recovery room in satisfactory condition.   POSTOPERATIVE PLAN: The patient will be WBAT on the operative extremity. Lovenox 40mg /day x 4 weeks to start on POD#1. Perioperative IV antibiotics x 24 hours. PT/OT on POD#1.

## 2021-03-28 NOTE — Progress Notes (Signed)
PROGRESS NOTE    Michele Bernard  OVF:[id number] DOB: 08-13-1965 DOA: 03/28/2021 PCP: [facility]   Brief Narrative: Michele Bernard is a 55 y.o. female with a history of hypertension, hyperlipidemia, GERD. Patient presented secondary to severe right hip pain after a fall and found to have an acute right hip fracture.   Assessment & Plan:   Principal Problem:   Closed right hip fracture (HCC) Active Problems:   Hypokalemia   Hypomagnesemia   Primary hypertension   Hyperlipidemia   GERD (gastroesophageal reflux disease)   Right acute comminuted hip fracture Secondary to fall. Orthopedic surgery consulted with recommendation for surgical repair. -Orthopedic surgery recommendations: pending this morning  Hypokalemia Given supplemental potassium with resolution. Magnesium also low.  Hypomagnesemia IV magnesium  Primary hypertension Patient is on hydrochlorothiazide as an outpatient -Continue hydrochlorothiazide  Hyperlipidemia  -Continue fenofibrate and Lovaza  GERD -Continue Protonix   DVT prophylaxis: SCDs Code Status:   Code Status: Full Code Family Communication: None at bedside Disposition Plan: Discharge home pending orthopedic   Consultants:  Orthopedic surgery  Procedures:  None  Antimicrobials: None    Subjective: Continued hip pain  Objective: Vitals:   03/28/21 0400 03/28/21 0500 03/28/21 0600 03/28/21 0730  BP: (!) 148/79 (!) 146/69 (!) 149/74 116/68  Pulse: 75 97 100 87  Resp:  16 14 13   Temp:      TempSrc:      SpO2: 97% 93% 93% 94%  Weight:      Height:        Intake/Output Summary (Last 24 hours) at 03/28/2021 1000 Last data filed at 03/28/2021 0943 Gross per 24 hour  Intake 602.27 ml  Output --  Net 602.27 ml   Filed Weights   03/28/21 0049  Weight: 54.4 kg    Examination:  General exam: Appears calm and comfortable Respiratory system: Clear to auscultation. Respiratory effort  normal. Cardiovascular system: S1 & S2 heard, RRR. No murmurs, rubs, gallops or clicks. Gastrointestinal system: Abdomen is nondistended, soft and nontender. No organomegaly or masses felt. Normal bowel sounds heard. Central nervous system: Alert and oriented. No focal neurological deficits. Musculoskeletal: No edema. No calf tenderness. Right leg externally rotated Skin: No cyanosis. No rashes Psychiatry: Judgement and insight appear normal. Mood & affect appropriate.     Data Reviewed: I have personally reviewed following labs and imaging studies  CBC Lab Results  Component Value Date   WBC 11.1 (H) 03/28/2021   RBC 3.84 (L) 03/28/2021   HGB 12.5 03/28/2021   HCT 35.6 (L) 03/28/2021   MCV 92.7 03/28/2021   MCH 32.6 03/28/2021   PLT 253 03/28/2021   MCHC 35.1 03/28/2021   RDW 12.2 03/28/2021   LYMPHSABS 2.7 03/28/2021   MONOABS 0.6 03/28/2021   EOSABS 0.1 03/28/2021   BASOSABS 0.0 03/28/2021     Last metabolic panel Lab Results  Component Value Date   NA 134 (L) 03/28/2021   K 4.2 03/28/2021   CL 102 03/28/2021   CO2 25 03/28/2021   BUN 9 03/28/2021   CREATININE 0.53 03/28/2021   GLUCOSE 139 (H) 03/28/2021   GFRNONAA >60 03/28/2021   GFRAA >60 09/24/2012   CALCIUM 8.4 (L) 03/28/2021   PROT 7.3 09/24/2012   ALBUMIN 3.6 09/24/2012   BILITOT 0.5 09/24/2012   ALKPHOS 86 09/24/2012   AST 22 09/24/2012   ALT 22 09/24/2012   ANIONGAP 7 03/28/2021    CBG (last 3)  No results for input(s): GLUCAP in  the last 72 hours.   GFR: Estimated Creatinine Clearance: 66.6 mL/min (by C-G formula based on SCr of 0.53 mg/dL).  Coagulation Profile: Recent Labs  Lab 03/28/21 0202  INR 1.0    Recent Results (from the past 240 hour(s))  Resp Panel by RT-PCR (Flu A&B, Covid) Nasopharyngeal Swab     Status: None   Collection Time: 03/28/21  2:03 AM   Specimen: Nasopharyngeal Swab; Nasopharyngeal(NP) swabs in vial transport medium  Result Value Ref Range Status   SARS  Coronavirus 2 by RT PCR NEGATIVE NEGATIVE Final    Comment: (NOTE) SARS-CoV-2 target nucleic acids are NOT DETECTED.  The SARS-CoV-2 RNA is generally detectable in upper respiratory specimens during the acute phase of infection. The lowest concentration of SARS-CoV-2 viral copies this assay can detect is 138 copies/mL. A negative result does not preclude SARS-Cov-2 infection and should not be used as the sole basis for treatment or other patient management decisions. A negative result may occur with  improper specimen collection/handling, submission of specimen other than nasopharyngeal swab, presence of viral mutation(s) within the areas targeted by this assay, and inadequate number of viral copies(<138 copies/mL). A negative result must be combined with clinical observations, patient history, and epidemiological information. The expected result is Negative.  Fact Sheet for Patients:  BloggerCourse.com  Fact Sheet for Healthcare Providers:  SeriousBroker.it  This test is no t yet approved or cleared by the [place] FDA and  has been authorized for detection and/or diagnosis of SARS-CoV-2 by FDA under an Emergency Use Authorization (EUA). This EUA will remain  in effect (meaning this test can be used) for the duration of the COVID-19 declaration under Section 564(b)(1) of the Act, 21 U.S.C.section 360bbb-3(b)(1), unless the authorization is terminated  or revoked sooner.       Influenza A by PCR NEGATIVE NEGATIVE Final   Influenza B by PCR NEGATIVE NEGATIVE Final    Comment: (NOTE) The Xpert Xpress SARS-CoV-2/FLU/RSV plus assay is intended as an aid in the diagnosis of influenza from Nasopharyngeal swab specimens and should not be used as a sole basis for treatment. Nasal washings and aspirates are unacceptable for Xpert Xpress SARS-CoV-2/FLU/RSV testing.  Fact Sheet for  Patients: BloggerCourse.com  Fact Sheet for Healthcare Providers: SeriousBroker.it  This test is not yet approved or cleared by the [place] FDA and has been authorized for detection and/or diagnosis of SARS-CoV-2 by FDA under an Emergency Use Authorization (EUA). This EUA will remain in effect (meaning this test can be used) for the duration of the COVID-19 declaration under Section 564(b)(1) of the Act, 21 U.S.C. section 360bbb-3(b)(1), unless the authorization is terminated or revoked.  Performed at [facility], [address], [place], [place] [address]         Radiology Studies: DG Chest 1 View  Result Date: 03/28/2021 CLINICAL DATA:  Status post fall. EXAM: CHEST  1 VIEW COMPARISON:  July 13, 2017 FINDINGS: Chronic appearing increased lung markings are seen with mild areas of atelectasis and/or infiltrate noted within the bilateral lung bases, right greater than left. There is no evidence of a pleural effusion or pneumothorax. The heart size and mediastinal contours are within normal limits. The visualized skeletal structures are unremarkable. IMPRESSION: Chronic appearing increased lung markings with mild bibasilar atelectasis and/or infiltrate. Electronically Signed   By: Aram Candela M.D.   On: 03/28/2021 01:25   DG Hip Unilat W or Wo Pelvis 2-3 Views Right  Result Date: 03/28/2021 CLINICAL DATA:  Status post  fall. EXAM: DG HIP (WITH OR WITHOUT PELVIS) 2-3V RIGHT COMPARISON:  None. FINDINGS: Acute, comminuted inter trochanteric fracture is seen involving the proximal right femur. There is no evidence of dislocation. Soft tissue structures are unremarkable. IMPRESSION: Acute intertrochanteric fracture of the proximal right femur. Electronically Signed   By: Aram Candela M.D.   On: 03/28/2021 01:24        Scheduled Meds:  fenofibrate  160 mg Oral Daily   omega-3 acid ethyl esters  1 g Oral BID    pantoprazole  40 mg Oral Daily   Continuous Infusions:  sodium chloride 100 mL/hr at 03/28/21 0208   sodium chloride Stopped (03/28/21 0403)     LOS: 0 days     Jacquelin Hawking, MD [facility] 03/28/2021, 10:00 AM  If 7PM-7AM, please contact night-coverage [email or web address]

## 2021-03-28 NOTE — H&P (Signed)
H&P reviewed. No significant changes noted.  

## 2021-03-28 NOTE — H&P (Signed)
[place]   PATIENT NAME: Michele Bernard    MR#:  [id number]  DATE OF BIRTH:  1966-01-25  DATE OF ADMISSION:  03/28/2021  PRIMARY CARE PHYSICIAN: [facility]   Patient is coming from: Home  REQUESTING/REFERRING PHYSICIAN: Loleta Rose, MD  CHIEF COMPLAINT:   Chief Complaint  Patient presents with   Fall    HISTORY OF PRESENT ILLNESS:  Michele Bernard is a 55 y.o. female with medical history significant for GERD and hypertension as well as ongoing tobacco abuse, presented to emergency room with a concern of accidental mechanical fall with subsequent right hip pain.  The patient apparently was walking her dog who got attacked by pit bull dog and she got tangled up and fell to the pavement landing on her right hip.  She denies any presyncope or syncope.  No chest pain or palpitations.  No procedures or focal muscle weakness.  No headache or dizziness or blurred vision.   ED Course: When she came to the ER, vital signs were normal.  Labs revealed mild hyponatremia. Her potassium was 2.9.  CBC showed mild anemia.  Influenza antigens and COVID-19 PCR came back negative.  EKG as reviewed by me : Showed no sinus rhythm with a rate of 80 . Imaging: Portable chest ray showed chronic appearing increased lung markings with mild bibasilar atelectasis and/or infiltrate.    Right hip x-ray showed acute intertrochanteric fracture of the proximal right femur.  The patient was given 4 mg IV morphine sulfate and 4 mg IV Zofran, 10 mEq and hydration with IV normal saline at 100 mill per hour.  She will be admitted to a medical bed for further evaluation and management. PAST MEDICAL HISTORY:   Past Medical History:  Diagnosis Date   GERD (gastroesophageal reflux disease)    Hypertension     PAST SURGICAL HISTORY:   Past Surgical History:  Procedure Laterality Date   BLADDER SURGERY     Bladder tact   BREAST BIOPSY Left 07/03/2016   path pending    SOCIAL  HISTORY:   Social History   Tobacco Use   Smoking status: Every Day    Packs/day: 1.00    Years: 30.00    Pack years: 30.00    Types: Cigarettes   Smokeless tobacco: Never  Substance Use Topics   Alcohol use: No    FAMILY HISTORY:   Family History  Problem Relation Age of Onset   Hypertension Mother    Breast cancer Neg Hx     DRUG ALLERGIES:   Allergies  Allergen Reactions   Penicillins Hives    REVIEW OF SYSTEMS:   ROS As per history of present illness. All pertinent systems were reviewed above. Constitutional, HEENT, cardiovascular, respiratory, GI, GU, musculoskeletal, neuro, psychiatric, endocrine, integumentary and hematologic systems were reviewed and are otherwise negative/unremarkable except for positive findings mentioned above in the HPI.   MEDICATIONS AT HOME:   Prior to Admission medications   Medication Sig Start Date End Date Taking? Authorizing Provider  celecoxib (CELEBREX) 200 MG capsule Take 200 mg by mouth 2 (two) times daily. 10/02/20  Yes [provider]  fenofibrate (TRICOR) 145 MG tablet Take 145 mg by mouth daily. 12/10/20  Yes [provider]  hydrochlorothiazide (HYDRODIURIL) 25 MG tablet Take 25 mg by mouth daily. 10/02/20  Yes [provider]  Omega-3 Fatty Acids (FISH OIL) 1000 MG CAPS Take 1 capsule by mouth 2 (two) times daily. 03/25/21  Yes [provider]  omeprazole (PRILOSEC) 40 MG capsule Take 40 mg by mouth daily.   Yes [provider]  albuterol (PROVENTIL HFA;VENTOLIN HFA) 108 (90 Base) MCG/ACT inhaler Inhale 2 puffs into the lungs every 4 (four) hours as needed for wheezing or shortness of breath. 07/13/17   Cuthriell, Delorise Royals, PA-C      VITAL SIGNS:  Blood pressure 106/81, pulse 90, temperature 97.8 F (36.6 C), temperature source Oral, resp. rate 18, height 5\' 6"  (1.676 m), weight 54.4 kg, SpO2 100 %.  PHYSICAL EXAMINATION:  Physical Exam  GENERAL:  55 y.o.-year-old Caucasian  female patient lying in the bed with no acute distress.  EYES: Pupils equal, round, reactive to light and accommodation. No scleral icterus. Extraocular muscles intact.  HEENT: Head atraumatic, normocephalic. Oropharynx and nasopharynx clear.  NECK:  Supple, no jugular venous distention. No thyroid enlargement, no tenderness.  LUNGS: Normal breath sounds bilaterally, no wheezing, rales,rhonchi or crepitation. No use of accessory muscles of respiration.  CARDIOVASCULAR: Regular rate and rhythm, S1, S2 normal. No murmurs, rubs, or gallops.  ABDOMEN: Soft, nondistended, nontender. Bowel sounds present. No organomegaly or mass.  EXTREMITIES: No pedal edema, cyanosis, or clubbing.  NEUROLOGIC: Cranial nerves II through XII are intact. Muscle strength 5/5 in all extremities. Sensation intact. Gait not checked. Musculoskeletal: Right lateral hip tenderness PSYCHIATRIC: The patient is alert and oriented x 3.  Normal affect and good eye contact. SKIN: No obvious rash, lesion, or ulcer.   LABORATORY PANEL:   CBC Recent Labs  Lab 03/28/21 0202  WBC 8.5  HGB 11.9*  HCT 34.4*  PLT 271   ------------------------------------------------------------------------------------------------------------------  Chemistries  Recent Labs  Lab 03/28/21 0202  NA 134*  K 2.9*  CL 101  CO2 24  GLUCOSE 112*  BUN 12  CREATININE 0.61  CALCIUM 8.5*   ------------------------------------------------------------------------------------------------------------------  Cardiac Enzymes No results for input(s): TROPONINI in the last 168 hours. ------------------------------------------------------------------------------------------------------------------  RADIOLOGY:  DG Chest 1 View  Result Date: 03/28/2021 CLINICAL DATA:  Status post fall. EXAM: CHEST  1 VIEW COMPARISON:  July 13, 2017 FINDINGS: Chronic appearing increased lung markings are seen with mild areas of atelectasis and/or infiltrate noted  within the bilateral lung bases, right greater than left. There is no evidence of a pleural effusion or pneumothorax. The heart size and mediastinal contours are within normal limits. The visualized skeletal structures are unremarkable. IMPRESSION: Chronic appearing increased lung markings with mild bibasilar atelectasis and/or infiltrate. Electronically Signed   By: July 15, 2017 M.D.   On: 03/28/2021 01:25   DG Hip Unilat W or Wo Pelvis 2-3 Views Right  Result Date: 03/28/2021 CLINICAL DATA:  Status post fall. EXAM: DG HIP (WITH OR WITHOUT PELVIS) 2-3V RIGHT COMPARISON:  None. FINDINGS: Acute, comminuted inter trochanteric fracture is seen involving the proximal right femur. There is no evidence of dislocation. Soft tissue structures are unremarkable. IMPRESSION: Acute intertrochanteric fracture of the proximal right femur. Electronically Signed   By: 05/28/2021 M.D.   On: 03/28/2021 01:24      IMPRESSION AND PLAN:  Active Problems:   Closed right hip fracture (HCC) 1.  Right hip  Right hip closed fracture secondary to mechanical fall. - The patient will be admitted to a medical bed on bedrest. - Pain management to be provided. - Orthopedic consultation will be obtained. - Dr. 05/28/2021 was notified and is aware about the patient.  2.  Hypokalemia. - Potassium will be replaced and magnesium level will be checked.  3.  Essential hypertension. - We will continue HCTZ.  4.  Dyslipidemia. - We will continue Tricor and fish oil.  5.  GERD. - We will continue PPI therapy.  DVT prophylaxis: SCDs.  Medical prophylaxis is postponed till postoperative period.   Code Status: full code.  Family Communication:  The plan of care was discussed in details with the patient (and family). I answered all questions. The patient agreed to proceed with the above mentioned plan. Further management will depend upon hospital course. Disposition Plan: Back to previous home environment Consults called:  Orthopedic consult. All the records are reviewed and case discussed with ED provider.  Status is: Inpatient  Remains inpatient appropriate because:Ongoing active pain requiring inpatient pain management, Ongoing diagnostic testing needed not appropriate for outpatient work up, Unsafe d/c plan, IV treatments appropriate due to intensity of illness or inability to take PO, and Inpatient level of care appropriate due to severity of illness  Dispo: The patient is from: Home              Anticipated d/c is to: SNF              Patient currently is medically stable to d/c.   Difficult to place patient No   TOTAL TIME TAKING CARE OF THIS PATIENT: 55 minutes.    Hannah Beat M.D on 03/28/2021 at 3:13 AM  [facility]   From 7 PM-7 AM, contact night-coverage [email or web address]  CC: Primary care physician; [facility]

## 2021-03-28 NOTE — Anesthesia Procedure Notes (Signed)
Procedure Name: Intubation Date/Time: 03/28/2021 2:20 PM Performed by: Henrietta Hoover, CRNA Pre-anesthesia Checklist: Patient identified, Emergency Drugs available, Suction available and Patient being monitored Patient Re-evaluated:Patient Re-evaluated prior to induction Oxygen Delivery Method: Circle system utilized Preoxygenation: Pre-oxygenation with 100% oxygen Induction Type: IV induction and Cricoid Pressure applied Ventilation: Mask ventilation without difficulty Laryngoscope Size: McGraph and 4 Grade View: Grade I Tube type: Oral Tube size: 7.0 mm Number of attempts: 1 Airway Equipment and Method: Stylet and Video-laryngoscopy Placement Confirmation: ETT inserted through vocal cords under direct vision, positive ETCO2 and breath sounds checked- equal and bilateral Secured at: 21 cm Tube secured with: Tape Dental Injury: Teeth and Oropharynx as per pre-operative assessment

## 2021-03-28 NOTE — ED Notes (Signed)
Blood specimens collected; held at bedside (blue, green, lavender, type and screen, SST, red)

## 2021-03-28 NOTE — Transfer of Care (Signed)
Immediate Anesthesia Transfer of Care Note  Patient: Kirkland Hun  Procedure(s) Performed: INTRAMEDULLARY (IM) NAIL INTERTROCHANTRIC (Right)  Patient Location: PACU  Anesthesia Type:General  Level of Consciousness: drowsy  Airway & Oxygen Therapy: Patient Spontanous Breathing and Patient connected to face mask oxygen  Post-op Assessment: Report given to RN and Post -op Vital signs reviewed and stable  Post vital signs: Reviewed and stable  Last Vitals:  Vitals Value Taken Time  BP 118/44   Temp    Pulse 63 03/28/21 1555  Resp 13 03/28/21 1555  SpO2 100 % 03/28/21 1555  Vitals shown include unvalidated device data.  Last Pain:  Vitals:   03/28/21 1150  TempSrc:   PainSc: 3          Complications: No notable events documented.

## 2021-03-28 NOTE — ED Triage Notes (Signed)
Patient brought in from home by EMS for Fall after unleashed dog and her dog became entangled; fall onto right hip; right leg appears shortened/ rotated; abrasions to right arm/ elbow; A/O x 4; skin warm dry; respirations even non-labored at this time

## 2021-03-28 NOTE — Anesthesia Preprocedure Evaluation (Signed)
Anesthesia Evaluation  Patient identified by MRN, date of birth, ID band Patient awake    Reviewed: Allergy & Precautions, NPO status , Patient's Chart, lab work & pertinent test results  History of Anesthesia Complications Negative for: history of anesthetic complications  Airway Mallampati: III  TM Distance: >3 FB Neck ROM: full    Dental  (+) Chipped, Poor Dentition, Missing   Pulmonary neg shortness of breath, COPD, Current Smoker and Patient abstained from smoking.,    Pulmonary exam normal        Cardiovascular Exercise Tolerance: Good hypertension, (-) angina(-) DOE Normal cardiovascular exam     Neuro/Psych negative neurological ROS  negative psych ROS   GI/Hepatic Neg liver ROS, GERD  Medicated and Controlled,  Endo/Other  negative endocrine ROS  Renal/GU      Musculoskeletal   Abdominal   Peds  Hematology negative hematology ROS (+)   Anesthesia Other Findings Past Medical History: No date: GERD (gastroesophageal reflux disease) No date: Hypertension  Past Surgical History: No date: BLADDER SURGERY     Comment:  Bladder tact 07/03/2016: BREAST BIOPSY; Left     Comment:  path pending  BMI    Body Mass Index: 19.37 kg/m      Reproductive/Obstetrics negative OB ROS                             Anesthesia Physical Anesthesia Plan  ASA: 3  Anesthesia Plan: Spinal   Post-op Pain Management:    Induction:   PONV Risk Score and Plan:   Airway Management Planned: Natural Airway and Nasal Cannula  Additional Equipment:   Intra-op Plan:   Post-operative Plan:   Informed Consent: I have reviewed the patients History and Physical, chart, labs and discussed the procedure including the risks, benefits and alternatives for the proposed anesthesia with the patient or authorized representative who has indicated his/her understanding and acceptance.     Dental Advisory  Given  Plan Discussed with: Anesthesiologist, CRNA and Surgeon  Anesthesia Plan Comments: (Patient reports no bleeding problems and no anticoagulant use.  Plan for spinal with backup GA  Patient consented for risks of anesthesia including but not limited to:  - adverse reactions to medications - damage to eyes, teeth, lips or other oral mucosa - nerve damage due to positioning  - risk of bleeding, infection and or nerve damage from spinal that could lead to paralysis - risk of headache or failed spinal - damage to teeth, lips or other oral mucosa - sore throat or hoarseness - damage to heart, brain, nerves, lungs, other parts of body or loss of life  Patient voiced understanding.)        Anesthesia Quick Evaluation

## 2021-03-28 NOTE — Plan of Care (Signed)

## 2021-03-29 ENCOUNTER — Encounter: Payer: Self-pay | Admitting: Orthopedic Surgery

## 2021-03-29 DIAGNOSIS — K219 Gastro-esophageal reflux disease without esophagitis: Secondary | ICD-10-CM

## 2021-03-29 DIAGNOSIS — S72001S Fracture of unspecified part of neck of right femur, sequela: Secondary | ICD-10-CM

## 2021-03-29 DIAGNOSIS — E781 Pure hyperglyceridemia: Secondary | ICD-10-CM

## 2021-03-29 LAB — CBC
HCT: 29.2 % — ABNORMAL LOW (ref 36.0–46.0)
Hemoglobin: 9.9 g/dL — ABNORMAL LOW (ref 12.0–15.0)
MCH: 32.5 pg (ref 26.0–34.0)
MCHC: 33.9 g/dL (ref 30.0–36.0)
MCV: 95.7 fL (ref 80.0–100.0)
Platelets: 224 10*3/uL (ref 150–400)
RBC: 3.05 MIL/uL — ABNORMAL LOW (ref 3.87–5.11)
RDW: 12.4 % (ref 11.5–15.5)
WBC: 9.2 10*3/uL (ref 4.0–10.5)
nRBC: 0 % (ref 0.0–0.2)

## 2021-03-29 LAB — BASIC METABOLIC PANEL
Anion gap: 4 — ABNORMAL LOW (ref 5–15)
BUN: 10 mg/dL (ref 6–20)
CO2: 25 mmol/L (ref 22–32)
Calcium: 8.2 mg/dL — ABNORMAL LOW (ref 8.9–10.3)
Chloride: 107 mmol/L (ref 98–111)
Creatinine, Ser: 0.46 mg/dL (ref 0.44–1.00)
GFR, Estimated: >60 mL/min (ref >60)
Glucose, Bld: 210 mg/dL — ABNORMAL HIGH (ref 70–99)
Potassium: 4 mmol/L (ref 3.5–5.1)
Sodium: 136 mmol/L (ref 135–145)

## 2021-03-29 MED ORDER — CLINDAMYCIN PHOSPHATE 600 MG/50ML IV SOLN
600.0000 mg | Freq: Three times a day (TID) | INTRAVENOUS | Status: AC
  Administered 2021-03-29 – 2021-03-30 (×3): 600 mg via INTRAVENOUS
  Filled 2021-03-29 (×3): qty 50

## 2021-03-29 MED ORDER — ENOXAPARIN SODIUM 40 MG/0.4ML IJ SOSY: 40.0000 mg | PREFILLED_SYRINGE | INTRAMUSCULAR | 0 refills | Status: AC

## 2021-03-29 MED ORDER — TRAMADOL HCL 50 MG PO TABS: 50.0000 mg | ORAL_TABLET | Freq: Four times a day (QID) | ORAL | 0 refills | Status: AC | PRN

## 2021-03-29 MED ORDER — PANTOPRAZOLE SODIUM 40 MG PO TBEC
40.0000 mg | DELAYED_RELEASE_TABLET | Freq: Every day | ORAL | Status: DC
  Administered 2021-03-29 – 2021-03-31 (×3): 40 mg via ORAL
  Filled 2021-03-29 (×3): qty 1

## 2021-03-29 MED ORDER — OXYCODONE HCL 5 MG PO TABS: 2.5000 mg | ORAL_TABLET | ORAL | 0 refills | Status: AC | PRN

## 2021-03-29 NOTE — Progress Notes (Signed)
Physical Therapy Treatment Patient Details Name: Michele Bernard MRN: [id number] DOB: 07/22/1966 Today's Date: 03/29/2021    History of Present Illness Pt. is a 55 y.o. female who was admitted to [facility] for IM nailing repair of an Intertrochanteric Fx sustained during a fall with PMHx includes: GERD and hypertension as well as ongoing tobacco abuse.    PT Comments    Pt received supine in bed agreeable to PT services. Reports soreness/pain up to 6/10 NPS in R hip.  Overall Pt remains needing HOB elevated and increased time to perform bed mobility to get to EOB. However pt is moving more quickly and less pain limiting than morning session. Still requiring rest breaks b/t transitions and t/f due to pain. Minguard to stand to RW with elevated bed to [facility] to void bladder continently. Remains indep with perihygiene. Progressed to amb 6' in room to foot of bed with minguard for safety. Pt reports lightheadedness so pt seated EOB. Vitals: HR: 96%, SPO2: 96%. Subsided once seated. Pt then stood and side stepped 2' to [facility] in order to improve ease of bed mobility. MinA of RLE to return supine in bed. Overall pt still limited by pain with progressing household ambulatory distances and requiring mod to max VC's for safe hand placement throughout. Although limited by pain, pt is stable with her mobility with no LOB with RW use. Pt situated to comfort in bed and educated on LE AROM therex she can perform prior to next PT session with verbalizing understanding. D/c recs remain appropriate.   Follow Up Recommendations  Home health PT;Follow surgeon's recommendation for DC plan and follow-up therapies;Supervision for mobility/OOB     Equipment Recommendations  Rolling walker with 5" wheels;3in1 (PT)    Recommendations for Other Services       Precautions / Restrictions Precautions Precautions: Fall Precaution Comments: R hip intramedullary nail s/p hip fx. WBAT on RLE. Restrictions Weight Bearing  Restrictions: Yes RLE Weight Bearing: Weight bearing as tolerated    Mobility  Bed Mobility Overal bed mobility: Needs Assistance Bed Mobility: Supine to Sit;Sit to Supine     Supine to sit: Supervision;HOB elevated Sit to supine: Min assist;HOB elevated   General bed mobility comments: MinA for repositioning in bed for RLE Patient Response: Cooperative  Transfers Overall transfer level: Needs assistance Equipment used: Rolling walker (2 wheeled) Transfers: Sit to/from Stand Sit to Stand: Min guard;From elevated surface         General transfer comment: Supervision, requires max VC's for UE sequencing  Ambulation/Gait Ambulation/Gait assistance: Min guard Gait Distance (Feet): 6 Feet Assistive device: Rolling walker (2 wheeled) Gait Pattern/deviations: Step-to pattern;Decreased stance time - right;Decreased step length - right;Decreased step length - left;Decreased weight shift to right;Antalgic     General Gait Details: Continues with heavy reliance on UE's on RW for support. Able to progress walking by 2-3 feet with less time to complete.   Stairs             Wheelchair Mobility    Modified Rankin (Stroke Patients Only)       Balance Overall balance assessment: Needs assistance Sitting-balance support: Feet supported;Feet unsupported Sitting balance-Leahy Scale: Good     Standing balance support: Bilateral upper extremity supported;During functional activity Standing balance-Leahy Scale: Poor Standing balance comment: HEAVY reliance of UE's on RW due to R hip pain. No LOB or balance concerns. Limited by pain only.  Cognition Arousal/Alertness: Awake/alert Behavior During Therapy: WFL for tasks assessed/performed Overall Cognitive Status: Within Functional Limits for tasks assessed                                        Exercises Other Exercises Other Exercises: Education on ankle pumps, quad  sets, glute sets, hip abd exercises (frequency, reps, sets)    General Comments        Pertinent Vitals/Pain Pain Assessment: 0-10 Pain Score: 6  Pain Location: R hip Pain Descriptors / Indicators: Aching Pain Intervention(s): Limited activity within patient's tolerance;Monitored during session;Repositioned    Home Living Family/patient expects to be discharged to:: Private residence Living Arrangements: Children Available Help at Discharge: Family;Available 24 hours/day Type of Home: House Home Access: Stairs to enter Entrance Stairs-Rails: Right Home Layout: One level Home Equipment: None      Prior Function Level of Independence: Independent          PT Goals (current goals can now be found in the care plan section) Acute Rehab PT Goals Patient Stated Goal: To return home PT Goal Formulation: With patient Time For Goal Achievement: 04/12/21 Potential to Achieve Goals: Good Progress towards PT goals: Progressing toward goals    Frequency    BID      PT Plan Current plan remains appropriate    Co-evaluation              AM-PAC PT "6 Clicks" Mobility   Outcome Measure  Help needed turning from your back to your side while in a flat bed without using bedrails?: A Little Help needed moving from lying on your back to sitting on the side of a flat bed without using bedrails?: A Little Help needed moving to and from a bed to a chair (including a wheelchair)?: A Little Help needed standing up from a chair using your arms (e.g., wheelchair or bedside chair)?: A Little Help needed to walk in hospital room?: A Lot Help needed climbing 3-5 steps with a railing? : A Lot 6 Click Score: 16    End of Session Equipment Utilized During Treatment: Gait belt Activity Tolerance: Patient limited by pain Patient left: in bed;with call bell/phone within reach;with family/visitor present;with nursing/sitter in room Nurse Communication: Mobility status PT Visit  Diagnosis: Pain;History of falling (Z91.81);Other abnormalities of gait and mobility (R26.89);Muscle weakness (generalized) (M62.81) Pain - Right/Left: Right Pain - part of body: Hip     Time: 2778-2423 PT Time Calculation (min) (ACUTE ONLY): 34 min  Charges:  $Gait Training: 8-22 mins $Therapeutic Activity: 8-22 mins                     Demyan Fugate M. Fairly IV, PT, DPT Physical Therapist- [place]  [facility]  03/29/2021, 3:30 PM

## 2021-03-29 NOTE — Anesthesia Postprocedure Evaluation (Signed)
Anesthesia Post Note  Patient: Michele Bernard  Procedure(s) Performed: INTRAMEDULLARY (IM) NAIL INTERTROCHANTRIC (Right)  Patient location during evaluation: PACU Anesthesia Type: Spinal Level of consciousness: awake and alert Pain management: pain level controlled Vital Signs Assessment: post-procedure vital signs reviewed and stable Respiratory status: spontaneous breathing, nonlabored ventilation, respiratory function stable and patient connected to nasal cannula oxygen Cardiovascular status: blood pressure returned to baseline and stable Postop Assessment: no apparent nausea or vomiting Anesthetic complications: no   No notable events documented.   Last Vitals:  Vitals:   03/28/21 1700 03/28/21 2000  BP: (!) 149/75 138/74  Pulse: 73 98  Resp: 16 17  Temp: 36.6 C 36.8 C  SpO2: 98% 98%    Last Pain:  Vitals:   03/29/21 0152  TempSrc:   PainSc: Asleep                 Cleda Mccreedy Michele Bernard

## 2021-03-29 NOTE — Progress Notes (Signed)
Met with the patient to discuss DC plan and needs She lives at home with her son, Step daughter and grand children, there is someone at home to help her all the time, she has transportation She needs a RW and a 3 in 1, has no insurance and will need charity DME, I notified Suanne Marker with [employer] She is not able to get home health PT and is agreeable to go to Outpatient, Faxed the referral to [phone] Will continue to monitor for needs

## 2021-03-29 NOTE — Evaluation (Signed)
Occupational Therapy Evaluation Patient Details Name: Michele Bernard MRN: [id number] DOB: 10-29-1965 Today's Date: 03/29/2021    History of Present Illness Pt. is a 55 y.o. female who was admitted to [facility] for IM nailing repair of an Intertrochanteric Fx sustained during a fall with PMHx includes: GERD and hypertension as well as ongoing tobacco abuse.   Clinical Impression   Pt. resides at home with family. Pt. was independent with ADLs, and IADL functioning: including meal preparation, and medication management. 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 Pt. education was provided about A/E use for LE ADLs. Pt. needs additional review of the A/E use for LE ADLs. Reviewed anticipated IADL needs at home with the pt. Pt. Was assisted with repositioning of the RLE for comfort with minA. Pt. Will benefit from OT services for ADL training, A/E training, and pt. Education about home modification, and DME. Pt. Plans to return home upon discharge with family to assist pt. as needed. Pt. Could benefit from follow-up HHOT services upon discharge.     Follow Up Recommendations  Home health OT    Equipment Recommendations  3 in 1 bedside commode    Recommendations for Other Services       Precautions / Restrictions Precautions Precautions: Fall Precaution Comments: R hip intramedullary nail s/p hip fx. WBAT on RLE. Restrictions Weight Bearing Restrictions: Yes RLE Weight Bearing: Weight bearing as tolerated      Mobility Bed Mobility Overal bed mobility: Needs Assistance Bed Mobility: Supine to Sit     Supine to sit: Supervision;Min assist     General bed mobility comments: MinA for repositioning in bed    Transfers Overall transfer level: Needs assistance Equipment used: Rolling walker (2 wheeled) Transfers: Sit to/from Stand Sit to Stand: Min guard;From elevated surface         General transfer comment: Deferred: Pt. declined OOB activity following  PT.    Balance Overall balance assessment: Needs assistance Sitting-balance support: Bilateral upper extremity supported;Feet supported Sitting balance-Leahy Scale: Fair     Standing balance support: Bilateral upper extremity supported;During functional activity Standing balance-Leahy Scale: Poor Standing balance comment: HEAVY reliance of UE's on RW due to R hip pain. No LOB or balance concerns. Limited by pain only.                           ADL either performed or assessed with clinical judgement   ADL Overall ADL's : Needs assistance/impaired                                       General ADL Comments: Max A LE ADLs     Vision Baseline Vision/History: 1 Wears glasses Patient Visual Report: No change from baseline       Perception     Praxis      Pertinent Vitals/Pain Pain Assessment: 0-10 Pain Score: 6  Pain Location: R hip Pain Descriptors / Indicators: Aching Pain Intervention(s): Limited activity within patient's tolerance;Monitored during session;Repositioned     Hand Dominance Right   Extremity/Trunk Assessment Upper Extremity Assessment Upper Extremity Assessment: Overall WFL for tasks assessed   Lower Extremity Assessment Lower Extremity Assessment: Generalized weakness;RLE deficits/detail RLE Deficits / Details: RLE hip fx   Cervical / Trunk Assessment Cervical / Trunk Assessment: Normal   Communication Communication Communication: No difficulties   Cognition Arousal/Alertness:  Awake/alert Behavior During Therapy: WFL for tasks assessed/performed Overall Cognitive Status: Within Functional Limits for tasks assessed                                     General Comments       Exercises   Shoulder Instructions      Home Living Family/patient expects to be discharged to:: Private residence Living Arrangements: Children Available Help at Discharge: Family;Available 24 hours/day Type of Home:  House Home Access: Stairs to enter [employer] of Steps: 2 small steps Entrance Stairs-Rails: Right Home Layout: One level     Bathroom Shower/Tub: IT trainer: Standard Bathroom Accessibility: Yes   Home Equipment: None          Prior Functioning/Environment Level of Independence: Independent                 OT Problem List: Decreased strength;Pain;Decreased knowledge of use of DME or AE;Impaired UE functional use      OT Treatment/Interventions: Self-care/ADL training;Therapeutic exercise;Patient/family education;DME and/or AE instruction;Therapeutic activities    OT Goals(Current goals can be found in the care plan section) Acute Rehab OT Goals Patient Stated Goal: To return home OT Goal Formulation: With patient Potential to Achieve Goals: Good  OT Frequency: Min 2X/week   Barriers to D/C:            Co-evaluation              AM-PAC OT "6 Clicks" Daily Activity     Outcome Measure Help from another person eating meals?: None Help from another person taking care of personal grooming?: A Little Help from another person toileting, which includes using toliet, bedpan, or urinal?: A Lot Help from another person bathing (including washing, rinsing, drying)?: A Lot Help from another person to put on and taking off regular upper body clothing?: None Help from another person to put on and taking off regular lower body clothing?: A Lot 6 Click Score: 17   End of Session    Activity Tolerance: Patient tolerated treatment well Patient left: in bed;with call bell/phone within reach;with bed alarm set  OT Visit Diagnosis: Unsteadiness on feet (R26.81);Muscle weakness (generalized) (M62.81)                Time: 3536-1443 OT Time Calculation (min): 20 min Charges:  OT General Charges $OT Visit: 1 Visit OT Evaluation $OT Eval Moderate Complexity: 1 Mod  Olegario Messier, MS, OTR/L   Olegario Messier 03/29/2021,  12:43 PM

## 2021-03-29 NOTE — Discharge Instructions (Signed)
INSTRUCTIONS AFTER Surgery  Remove items at home which could result in a fall. This includes throw rugs or furniture in walking pathways ICE to the affected joint every three hours while awake for 30 minutes at a time, for at least the first 3-5 days, and then as needed for pain and swelling.  Continue to use ice for pain and swelling. You may notice swelling that will progress down to the foot and ankle.  This is normal after surgery.  Elevate your leg when you are not up walking on it.   Continue to use the breathing machine you got in the hospital (incentive spirometer) which will help keep your temperature down.  It is common for your temperature to cycle up and down following surgery, especially at night when you are not up moving around and exerting yourself.  The breathing machine keeps your lungs expanded and your temperature down.   DIET:  As you were doing prior to hospitalization, we recommend a well-balanced diet.  DRESSING / WOUND CARE / SHOWERING  Dressing change as needed.  No showering.  Staples will be removed in 2 weeks at [facility] clinic orthopedics.  Patient will also have x-rays of the right hip.  ACTIVITY  Increase activity slowly as tolerated, but follow the weight bearing instructions below.   No driving for 6 weeks or until further direction given by your physician.  You cannot drive while taking narcotics.  No lifting or carrying greater than 10 lbs. until further directed by your surgeon. Avoid periods of inactivity such as sitting longer than an hour when not asleep. This helps prevent blood clots.  You may return to work once you are authorized by your doctor.     WEIGHT BEARING  Weightbearing as tolerated on the right.   EXERCISES Gait training and ambulation training with physical therapy.  CONSTIPATION  Constipation is defined medically as fewer than three stools per week and severe constipation as less than one stool per week.  Even if you have a  regular bowel pattern at home, your normal regimen is likely to be disrupted due to multiple reasons following surgery.  Combination of anesthesia, postoperative narcotics, change in appetite and fluid intake all can affect your bowels.   YOU MUST use at least one of the following options; they are listed in order of increasing strength to get the job done.  They are all available over the counter, and you may need to use some, POSSIBLY even all of these options:    Drink plenty of fluids (prune juice may be helpful) and high fiber foods Colace 100 mg by mouth twice a day  Senokot for constipation as directed and as needed Dulcolax (bisacodyl), take with full glass of water  Miralax (polyethylene glycol) once or twice a day as needed.  If you have tried all these things and are unable to have a bowel movement in the first 3-4 days after surgery call either your surgeon or your primary doctor.    If you experience loose stools or diarrhea, hold the medications until you stool forms back up.  If your symptoms do not get better within 1 week or if they get worse, check with your doctor.  If you experience "the worst abdominal pain ever" or develop nausea or vomiting, please contact the office immediately for further recommendations for treatment.   ITCHING:  If you experience itching with your medications, try taking only a single pain pill, or even half a pain pill at  a time.  You can also use Benadryl over the counter for itching or also to help with sleep.   TED HOSE STOCKINGS:  Use stockings on both legs until for at least 2 weeks or as directed by physician office. They may be removed at night for sleeping.  MEDICATIONS:  See your medication summary on the "After Visit Summary" that nursing will review with you.  You may have some home medications which will be placed on hold until you complete the course of blood thinner medication.  It is important for you to complete the blood thinner  medication as prescribed.  PRECAUTIONS:  If you experience chest pain or shortness of breath - call 911 immediately for transfer to the hospital emergency department.   If you develop a fever greater that 101 F, purulent drainage from wound, increased redness or drainage from wound, foul odor from the wound/dressing, or calf pain - CONTACT YOUR SURGEON.                                                   FOLLOW-UP APPOINTMENTS:  If you do not already have a post-op appointment, please call the office for an appointment to be seen by your surgeon.  Guidelines for how soon to be seen are listed in your "After Visit Summary", but are typically between 1-4 weeks after surgery.  OTHER INSTRUCTIONS:     MAKE SURE YOU:  Understand these instructions.  Get help right away if you are not doing well or get worse.    Thank you for letting us be a part of your medical care team.  It is a privilege we respect greatly.  We hope these instructions will help you stay on track for a fast and full recovery!

## 2021-03-29 NOTE — Progress Notes (Signed)
  Subjective: 1 Day Post-Op Procedure(s) (LRB): INTRAMEDULLARY (IM) NAIL INTERTROCHANTRIC (Right) Patient reports pain as moderate.   Patient is well, and has had no acute complaints or problems Plan is to go Home after hospital stay. Negative for chest pain and shortness of breath Fever: no Gastrointestinal: Negative for nausea and vomiting  Objective: Vital signs in last 24 hours: Temp:  [97.6 F (36.4 C)-98.4 F (36.9 C)] 98.3 F (36.8 C) (09/01 2000) Pulse Rate:  [60-98] 98 (09/01 2000) Resp:  [12-18] 17 (09/01 2000) BP: (114-149)/(44-75) 138/74 (09/01 2000) SpO2:  [89 %-100 %] 98 % (09/01 2000) Weight:  [53.1 kg-60.4 kg] 60.4 kg (09/01 1733)  Intake/Output from previous day:  Intake/Output Summary (Last 24 hours) at 03/29/2021 3007 Last data filed at 03/29/2021 6226 Gross per 24 hour  Intake 1782.01 ml  Output 2500 ml  Net -717.99 ml    Intake/Output this shift: Total I/O In: 729.7 [I.V.:729.7] Out: 1750 [Urine:1750]  Labs: Recent Labs    03/28/21 0202 03/28/21 0637 03/29/21 0240  HGB 11.9* 12.5 9.9*   Recent Labs    03/28/21 0637 03/29/21 0240  WBC 11.1* 9.2  RBC 3.84* 3.05*  HCT 35.6* 29.2*  PLT 253 224   Recent Labs    03/28/21 0637 03/29/21 0240  NA 134* 136  K 4.2 4.0  CL 102 107  CO2 25 25  BUN 9 10  CREATININE 0.53 0.46  GLUCOSE 139* 210*  CALCIUM 8.4* 8.2*   Recent Labs    03/28/21 0202  INR 1.0     EXAM General - Patient is Alert and Oriented Extremity - Neurovascular intact Sensation intact distally Dorsiflexion/Plantar flexion intact Compartment soft Dressing/Incision - clean, dry, scant blood tinged drainage Motor Function - intact, moving foot and toes well on exam.   Past Medical History:  Diagnosis Date   GERD (gastroesophageal reflux disease)    Hypertension     Assessment/Plan: 1 Day Post-Op Procedure(s) (LRB): INTRAMEDULLARY (IM) NAIL INTERTROCHANTRIC (Right) Principal Problem:   Closed right hip fracture  (HCC) Active Problems:   Hypokalemia   Hypomagnesemia   Primary hypertension   Hyperlipidemia   GERD (gastroesophageal reflux disease)  Estimated body mass index is 21.49 kg/m as calculated from the following:   Height as of this encounter: 5\' 6"  (1.676 m).   Weight as of this encounter: 60.4 kg. Advance diet Up with therapy D/C IV fluids  DVT Prophylaxis - Lovenox and TED hose Weight-Bearing as tolerated to right leg  , PA-C Orthopaedic Surgery 03/29/2021, 6:24 AM

## 2021-03-29 NOTE — Evaluation (Signed)
Physical Therapy Evaluation Patient Details Name: Michele Bernard MRN: [id number] DOB: Nov 26, 1965 Today's Date: 03/29/2021   History of Present Illness  Michele Bernard is a 55 y.o. female with medical history significant for GERD and hypertension as well as ongoing tobacco abuse, presented to emergency room with a concern of accidental mechanical fall with subsequent right hip pain. S/P R intramedullary nailing on 9/1. WBAT on RLE.   Clinical Impression  Pt admitted with above diagnosis. Pt agreeable to PT services with boy friend present in room. Pt reports being fully independent with all ADL's and IADL's prior to mechanical fall from dog attacking her dog. Pt currently reporting 6/10 pain in R hip at rest. Pt required HOB elevated, extra time, and use of bed rails to t/f from supine to [facility] elevated and minA on RLE due to pain. Pt able to actively DF/PF at ankles but limited in LAQ's due to her pain. Bed elevated, pt able to stand with minguard with RW. Heavy reliance on UE's for support due to R hip pain. With extra time pt able to step to t/f to [facility] safely with VC's for sequencing and hand placement throughout. Just extra time needed due to pain. Continent void of bowl and bladder noted with pt indep with perihygiene in sitting. Same level of assist required to t/f back to EOB. ModA+1 sitting Eob to supine due to R hip pain. Overall pt requires little assist with OOB t/f and is most limited due to pain at surgical/fracture site. Anticipate if pain is well managed, pt will be safe to return home with HHPT services to progress with LRAD, strengthening, and limited functional mobility once ambulating safe household distances. Pt currently with functional limitations due to the deficits listed below (see PT Problem List). Pt will benefit from skilled PT to increase their independence and safety with mobility to allow discharge to the venue listed below.      Follow Up Recommendations Home health PT;Follow  surgeon's recommendation for DC plan and follow-up therapies;Supervision for mobility/OOB    Equipment Recommendations  Rolling walker with 5" wheels;3in1 (PT)    Recommendations for Other Services       Precautions / Restrictions Precautions Precautions: Other (comment) Precaution Comments: R hip intramedullary nail s/p hip fx. WBAT on RLE. Restrictions Weight Bearing Restrictions: Yes RLE Weight Bearing: Weight bearing as tolerated      Mobility  Bed Mobility Overal bed mobility: Needs Assistance Bed Mobility: Supine to Sit     Supine to sit: Supervision;Min assist     General bed mobility comments: MinA for RLE to EOB d/t pain Patient Response: Cooperative  Transfers Overall transfer level: Needs assistance Equipment used: Rolling walker (2 wheeled) Transfers: Sit to/from Stand Sit to Stand: Min guard;From elevated surface         General transfer comment: Cuing for correct hand palcement on RW prior to standing.  Ambulation/Gait Ambulation/Gait assistance: Min guard Gait Distance (Feet): 4 Feet Assistive device: Rolling walker (2 wheeled) Gait Pattern/deviations: Step-to pattern;Decreased stance time - right;Decreased step length - right;Decreased step length - left;Decreased weight shift to right;Antalgic     General Gait Details: Very antalgic with limited WB'ing on RLE with t/f to [facility]. HEAVY reliance of UE's on RW for support due to pain in R hip.  Stairs            Wheelchair Mobility    Modified Rankin (Stroke Patients Only)       Balance Overall balance assessment: Needs assistance  Sitting-balance support: Bilateral upper extremity supported;Feet supported Sitting balance-Leahy Scale: Fair     Standing balance support: Bilateral upper extremity supported;During functional activity Standing balance-Leahy Scale: Poor Standing balance comment: HEAVY reliance of UE's on RW due to R hip pain. No LOB or balance concerns. Limited by pain  only.                             Pertinent Vitals/Pain Pain Assessment: 0-10 Pain Score: 6  Pain Location: R hip Pain Descriptors / Indicators: Throbbing;Sharp Pain Intervention(s): Limited activity within patient's tolerance;Monitored during session;Repositioned    Home Living Family/patient expects to be discharged to:: Private residence Living Arrangements: Children Available Help at Discharge: Family;Available 24 hours/day Type of Home: House Home Access: Stairs to enter Entrance Stairs-Rails: Right Entrance Stairs-Number of Steps: 2 small steps Home Layout: One level Home Equipment: None      Prior Function Level of Independence: Independent               Hand Dominance        Extremity/Trunk Assessment   Upper Extremity Assessment Upper Extremity Assessment: Overall WFL for tasks assessed    Lower Extremity Assessment Lower Extremity Assessment: Generalized weakness;RLE deficits/detail RLE Deficits / Details: RLE hip fx    Cervical / Trunk Assessment Cervical / Trunk Assessment: Normal  Communication   Communication: No difficulties  Cognition Arousal/Alertness: Awake/alert Behavior During Therapy: WFL for tasks assessed/performed Overall Cognitive Status: Within Functional Limits for tasks assessed                                        General Comments      Exercises Total Joint Exercises Ankle Circles/Pumps: AROM;Both;10 reps Knee Flexion: AROM;Right;5 reps Other Exercises Other Exercises: Role of PT in acute setting, WB'ing precautions, t/f bed <> BSC with RW.   Assessment/Plan    PT Assessment Patient needs continued PT services  PT Problem List Decreased strength;Decreased mobility;Decreased safety awareness;Decreased range of motion;Decreased activity tolerance;Pain;Decreased knowledge of use of DME       PT Treatment Interventions DME instruction;Therapeutic exercise;Gait training;Balance  training;Stair training;Neuromuscular re-education;Functional mobility training;Therapeutic activities;Patient/family education    PT Goals (Current goals can be found in the Care Plan section)  Acute Rehab PT Goals Patient Stated Goal: improve pain, go home PT Goal Formulation: With patient Time For Goal Achievement: 04/12/21 Potential to Achieve Goals: Good    Frequency BID   Barriers to discharge   2 STE home    Co-evaluation               AM-PAC PT "6 Clicks" Mobility  Outcome Measure Help needed turning from your back to your side while in a flat bed without using bedrails?: A Little Help needed moving from lying on your back to sitting on the side of a flat bed without using bedrails?: A Little Help needed moving to and from a bed to a chair (including a wheelchair)?: A Little Help needed standing up from a chair using your arms (e.g., wheelchair or bedside chair)?: A Little Help needed to walk in hospital room?: A Lot Help needed climbing 3-5 steps with a railing? : A Lot 6 Click Score: 16    End of Session Equipment Utilized During Treatment: Gait belt Activity Tolerance: Patient limited by pain Patient left: in bed;with call bell/phone within reach;with family/visitor  present Nurse Communication: Mobility status PT Visit Diagnosis: Pain;History of falling (Z91.81);Other abnormalities of gait and mobility (R26.89);Muscle weakness (generalized) (M62.81) Pain - Right/Left: Right Pain - part of body: Hip    Time: 0816-0850 PT Time Calculation (min) (ACUTE ONLY): 34 min   Charges:   PT Evaluation $PT Eval Low Complexity: 1 Low PT Treatments $Therapeutic Activity: 23-37 mins        Ruthe Roemer M. Fairly IV, PT, DPT Physical Therapist- [place]  [facility]  03/29/2021, 9:47 AM

## 2021-03-29 NOTE — Progress Notes (Signed)
Patient ID: Michele Bernard, female   DOB: 09/25/1965, 55 y.o.   MRN: [id number] [facility] PROGRESS NOTE  Michele Bernard PFX:[id number] DOB: [birth year] DOA: 03/28/2021 [facility]  HPI/Subjective: Patient was outside with her dog and another dog that was not hers that was not on the leash started running over and knocked her over and was standing on her head and hair.  She could not get up.  The person that was with the dog carried her into her house.  Patient was having a lot of pain so she called 911 and found to have a hip fracture.  Objective: Vitals:   03/29/21 1248 03/29/21 1538  BP: 113/63 115/65  Pulse: 82 78  Resp: 17 16  Temp: 98.2 F (36.8 C) 98.6 F (37 C)  SpO2: 99% 97%    Intake/Output Summary (Last 24 hours) at 03/29/2021 1633 Last data filed at 03/29/2021 1401 Gross per 24 hour  Intake 1509.74 ml  Output 1750 ml  Net -240.26 ml   Filed Weights   03/28/21 0049 03/28/21 1130 03/28/21 1733  Weight: 54.4 kg 53.1 kg 60.4 kg    ROS: Review of Systems  Respiratory:  Negative for shortness of breath.   Cardiovascular:  Negative for chest pain.  Gastrointestinal:  Negative for abdominal pain, nausea and vomiting.  Exam: Physical Exam HENT:     Head: Normocephalic.     Mouth/Throat:     Pharynx: No oropharyngeal exudate.  Eyes:     General: Lids are normal.     Conjunctiva/sclera: Conjunctivae normal.  Cardiovascular:     Rate and Rhythm: Normal rate and regular rhythm.     Heart sounds: Normal heart sounds, S1 normal and S2 normal.  Pulmonary:     Breath sounds: Normal breath sounds. No decreased breath sounds, wheezing, rhonchi or rales.  Abdominal:     Palpations: Abdomen is soft.     Tenderness: There is no abdominal tenderness.  Musculoskeletal:     Right lower leg: No swelling.     Left lower leg: No swelling.  Skin:    General: Skin is warm.     Findings: No rash.  Neurological:     Mental Status: She is alert  and oriented to person, place, and time.      Scheduled Meds:  acetaminophen  1,000 mg Oral Q8H   Chlorhexidine Gluconate Cloth  6 each Topical Daily   docusate sodium  100 mg Oral BID   enoxaparin (LOVENOX) injection  40 mg Subcutaneous Q24H   fenofibrate  160 mg Oral Daily   pantoprazole  40 mg Oral Daily   Continuous Infusions:  sodium chloride 75 mL/hr at 03/28/21 1801   clindamycin (CLEOCIN) IV 600 mg (03/29/21 1020)   methocarbamol (ROBAXIN) IV      Assessment/Plan:  Right hip fracture status post operative repair.  Pain control.  Hopefully will be able to go home.  Continue working with physical therapy. Hypokalemia and hypomagnesemia.  Hold off on hydrochlorothiazide at this point.  Electrolytes replaced Essential hypertension blood pressure on the lower side we will hold off on hydrochlorothiazide Hypertriglyceridemia on fenofibrate GERD on PPI Rash with Ancef switched to Cleocin for 3 doses post operative prophylaxis antibiotics.        Code Status:     Code Status Orders  (From admission, onward)           Start     Ordered   03/28/21 0312  Full code  Continuous        03/28/21 0312           Code Status History     This patient has a current code status but no historical code status.      Family Communication: Family at bedside Disposition Plan: Status is: Inpatient  Dispo: The patient is from: Home              Anticipated d/c is to: Home              Patient currently postoperative day 1 hip fracture   Difficult to place patient.  No.  Consultants: Orthopedic surgery  Procedures: Right hip repair  Time spent: 27 minutes  [employer]

## 2021-03-30 DIAGNOSIS — R710 Precipitous drop in hematocrit: Secondary | ICD-10-CM

## 2021-03-30 LAB — CBC
HCT: 24.5 % — ABNORMAL LOW (ref 36.0–46.0)
Hemoglobin: 8.1 g/dL — ABNORMAL LOW (ref 12.0–15.0)
MCH: 31.3 pg (ref 26.0–34.0)
MCHC: 33.1 g/dL (ref 30.0–36.0)
MCV: 94.6 fL (ref 80.0–100.0)
Platelets: 195 10*3/uL (ref 150–400)
RBC: 2.59 MIL/uL — ABNORMAL LOW (ref 3.87–5.11)
RDW: 12.8 % (ref 11.5–15.5)
WBC: 7.9 10*3/uL (ref 4.0–10.5)
nRBC: 0 % (ref 0.0–0.2)

## 2021-03-30 LAB — BASIC METABOLIC PANEL
Anion gap: 4 — ABNORMAL LOW (ref 5–15)
BUN: 6 mg/dL (ref 6–20)
CO2: 28 mmol/L (ref 22–32)
Calcium: 8.1 mg/dL — ABNORMAL LOW (ref 8.9–10.3)
Chloride: 108 mmol/L (ref 98–111)
Creatinine, Ser: 0.51 mg/dL (ref 0.44–1.00)
GFR, Estimated: >60 mL/min (ref >60)
Glucose, Bld: 156 mg/dL — ABNORMAL HIGH (ref 70–99)
Potassium: 3.6 mmol/L (ref 3.5–5.1)
Sodium: 140 mmol/L (ref 135–145)

## 2021-03-30 LAB — MAGNESIUM: Magnesium: 1.6 mg/dL — ABNORMAL LOW (ref 1.7–2.4)

## 2021-03-30 MED ORDER — MAGNESIUM SULFATE 2 GM/50ML IV SOLN
2.0000 g | Freq: Once | INTRAVENOUS | Status: AC
  Administered 2021-03-30: 2 g via INTRAVENOUS
  Filled 2021-03-30: qty 50

## 2021-03-30 MED ORDER — FE FUMARATE-B12-VIT C-FA-IFC PO CAPS
1.0000 | ORAL_CAPSULE | Freq: Two times a day (BID) | ORAL | Status: DC
  Administered 2021-03-30 – 2021-03-31 (×3): 1 via ORAL
  Filled 2021-03-30 (×6): qty 1

## 2021-03-30 MED ORDER — POTASSIUM CHLORIDE CRYS ER 20 MEQ PO TBCR
40.0000 meq | EXTENDED_RELEASE_TABLET | Freq: Every day | ORAL | Status: DC
  Administered 2021-03-30 – 2021-03-31 (×2): 40 meq via ORAL
  Filled 2021-03-30 (×2): qty 2

## 2021-03-30 NOTE — Progress Notes (Signed)
Physical Therapy Treatment Patient Details Name: Michele Bernard MRN: [id number] DOB: [birth year] Today's Date: 03/30/2021    History of Present Illness Pt. is a 55 y.o. female who was admitted to [facility] for IM nailing repair of an Intertrochanteric Fx sustained during a fall with PMHx includes: GERD and hypertension as well as ongoing tobacco abuse.    PT Comments    Pt's primary limiter continues to be severe R hip pain with any activity.  She showed good effort but simply struggled with needing heavy UEs during R WBing and pain/weakness with attempts to flex/advance R hip and therefore quite limited progress with ambulation.  She did surprisingly well negotiating up/down steps using backward strategy.  Pt still needing to show increased confidence and distance with ambulation before home is fully appropriate but making slow and steady gains.     Follow Up Recommendations  Home health PT;Follow surgeon's recommendation for DC plan and follow-up therapies     Equipment Recommendations  Rolling walker with 5" wheels;3in1 (PT)    Recommendations for Other Services       Precautions / Restrictions Precautions Precautions: Fall Restrictions RLE Weight Bearing: Weight bearing as tolerated    Mobility  Bed Mobility Overal bed mobility: Needs Assistance Bed Mobility: Supine to Sit       Sit to supine: Min assist   General bed mobility comments: Pt showed good effort, needed UEs to get R LE to EOB.  Ultimately needed only light direct assist to attain sitting EOB    Transfers Overall transfer level: Needs assistance Equipment used: Rolling walker (2 wheeled) Transfers: Sit to/from Stand Sit to Stand: Min guard         General transfer comment: 3 seperate sit to stand efforts today with heavy UE reliance and cuing for set up and UE use, but did not require direct assist to attain upright  Ambulation/Gait Ambulation/Gait assistance: Min guard Gait Distance (Feet): 15  Feet Assistive device: Rolling walker (2 wheeled)       General Gait Details: Continues to be very pain limited with heavy reliance on UE's on RW for support during R stance phase. She occasionally managed to step/advance R LE past L but struggled to consistently move the R LE more than a few inches with each attempt.   Stairs Stairs: Yes Stairs assistance: Min guard Stair Management: Backwards;With walker Number of Stairs: 4 General stair comments: Despite her limitations during ambulation pt actually did relativley well with stair management.  She was able to negotiate up and down safely and with assist only for equipment management/safety   Wheelchair Mobility    Modified Rankin (Stroke Patients Only)       Balance Overall balance assessment: Needs assistance Sitting-balance support: No upper extremity supported;Feet supported Sitting balance-Leahy Scale: Good       Standing balance-Leahy Scale: Fair Standing balance comment: HEAVY reliance of UE's on RW due to R hip pain. No LOBs or balance concerns.                            Cognition Arousal/Alertness: Awake/alert Behavior During Therapy: WFL for tasks assessed/performed Overall Cognitive Status: Within Functional Limits for tasks assessed                                        Exercises General Exercises - Lower Extremity Ankle Circles/Pumps:  AROM;Strengthening;10 reps Quad Sets: Strengthening;10 reps Short Arc Quad: AAROM;10 reps;AROM Heel Slides: Strengthening;10 reps;AROM Hip ABduction/ADduction: Strengthening;AROM;10 reps    General Comments General comments (skin integrity, edema, etc.): Pt's primarly limitation continues to be pain      Pertinent Vitals/Pain Pain Assessment: 0-10 Pain Score: 4  (at rest, 9/10 with any activity) Pain Location: R hip    Home Living                      Prior Function            PT Goals (current goals can now be found in  the care plan section) Progress towards PT goals: Progressing toward goals    Frequency    BID      PT Plan Current plan remains appropriate    Co-evaluation              AM-PAC PT "6 Clicks" Mobility   Outcome Measure  Help needed turning from your back to your side while in a flat bed without using bedrails?: A Little Help needed moving from lying on your back to sitting on the side of a flat bed without using bedrails?: A Little Help needed moving to and from a bed to a chair (including a wheelchair)?: A Little Help needed standing up from a chair using your arms (e.g., wheelchair or bedside chair)?: A Little Help needed to walk in hospital room?: A Lot Help needed climbing 3-5 steps with a railing? : A Little 6 Click Score: 17    End of Session Equipment Utilized During Treatment: Gait belt Activity Tolerance: Patient limited by pain Patient left: with chair alarm set;with call bell/phone within reach Nurse Communication: Mobility status (coordinating pain meds for PM session) PT Visit Diagnosis: Pain;History of falling (Z91.81);Other abnormalities of gait and mobility (R26.89);Muscle weakness (generalized) (M62.81) Pain - Right/Left: Right Pain - part of body: Hip     Time: 0940-1020 PT Time Calculation (min) (ACUTE ONLY): 40 min  Charges:  $Gait Training: 8-22 mins $Therapeutic Exercise: 8-22 mins $Therapeutic Activity: 8-22 mins                     Malachi Pro, DPT 03/30/2021, 1:20 PM

## 2021-03-30 NOTE — Progress Notes (Signed)
  Subjective: 2 Days Post-Op Procedure(s) (LRB): INTRAMEDULLARY (IM) NAIL INTERTROCHANTRIC (Right) Patient reports pain as moderate.   Patient is well, and has had no acute complaints or problems Plan is to go Home after hospital stay. Negative for chest pain and shortness of breath Fever: no Gastrointestinal: Negative for nausea and vomiting  Objective: Vital signs in last 24 hours: Temp:  [98.1 F (36.7 C)-98.9 F (37.2 C)] 98.6 F (37 C) (09/03 0808) Pulse Rate:  [77-96] 77 (09/03 0808) Resp:  [16-18] 17 (09/03 0808) BP: (109-137)/(63-72) 137/71 (09/03 0808) SpO2:  [96 %-100 %] 100 % (09/03 0808)  Intake/Output from previous day:  Intake/Output Summary (Last 24 hours) at 03/30/2021 0817 Last data filed at 03/29/2021 1401 Gross per 24 hour  Intake 480 ml  Output --  Net 480 ml    Intake/Output this shift: No intake/output data recorded.  Labs: Recent Labs    03/28/21 0202 03/28/21 0637 03/29/21 0240 03/30/21 0431  HGB 11.9* 12.5 9.9* 8.1*   Recent Labs    03/29/21 0240 03/30/21 0431  WBC 9.2 7.9  RBC 3.05* 2.59*  HCT 29.2* 24.5*  PLT 224 195   Recent Labs    03/29/21 0240 03/30/21 0431  NA 136 140  K 4.0 3.6  CL 107 108  CO2 25 28  BUN 10 6  CREATININE 0.46 0.51  GLUCOSE 210* 156*  CALCIUM 8.2* 8.1*   Recent Labs    03/28/21 0202  INR 1.0     EXAM General - Patient is Alert and Oriented Extremity - Neurovascular intact Sensation intact distally Dorsiflexion/Plantar flexion intact Compartment soft Dressing/Incision - clean, dry, scant blood tinged drainage Motor Function - intact, moving foot and toes well on exam.   Past Medical History:  Diagnosis Date   GERD (gastroesophageal reflux disease)    Hypertension     Assessment/Plan: 2 Days Post-Op Procedure(s) (LRB): INTRAMEDULLARY (IM) NAIL INTERTROCHANTRIC (Right) Principal Problem:   Closed fracture of right hip requiring operative repair [facility]) Active Problems:   Hypokalemia    Hypomagnesemia   Essential hypertension   Hypertriglyceridemia   GERD (gastroesophageal reflux disease)  Estimated body mass index is 21.49 kg/m as calculated from the following:   Height as of this encounter: 5\' 6"  (1.676 m).   Weight as of this encounter: 60.4 kg. Advance diet Up with therapy Positive bowel movement yesterday Acute postop blood loss anemia -hemoglobin 8.1.  Start iron supplement and recheck hemoglobin in the morning Care management to assist with discharge to home with home health PT pending completion of PT goals.  Most likely will possibly discharge home tomorrow given current progress with PT.  DVT Prophylaxis - Lovenox and TED hose Weight-Bearing as tolerated to right leg  T. PA-C Orthopaedic Surgery 03/30/2021, 8:17 AM

## 2021-03-30 NOTE — Progress Notes (Signed)
Patient ID: Michele Bernard, female   DOB: Mar 17, 1966, 55 y.o.   MRN: [id number] [facility] PROGRESS NOTE  Michele Bernard FIE:[id number] DOB: Jul 13, 1966 DOA: 03/28/2021 [facility]  HPI/Subjective: Patient feeling okay.  Has some right hip pain.  Still having some trouble with walking.  Admitted with hip fracture.  Objective: Vitals:   03/30/21 0808 03/30/21 1618  BP: 137/71 131/64  Pulse: 77 94  Resp: 17 17  Temp: 98.6 F (37 C) 98.1 F (36.7 C)  SpO2: 100% 97%   No intake or output data in the 24 hours ending 03/30/21 1717 Filed Weights   03/28/21 0049 03/28/21 1130 03/28/21 1733  Weight: 54.4 kg 53.1 kg 60.4 kg    ROS: Review of Systems  Respiratory:  Negative for shortness of breath.   Cardiovascular:  Negative for chest pain.  Gastrointestinal:  Negative for abdominal pain, nausea and vomiting.  Exam: Physical Exam HENT:     Head: Normocephalic.     Mouth/Throat:     Pharynx: No oropharyngeal exudate.  Eyes:     General: Lids are normal.     Conjunctiva/sclera: Conjunctivae normal.  Cardiovascular:     Rate and Rhythm: Normal rate and regular rhythm.     Heart sounds: Normal heart sounds, S1 normal and S2 normal.  Pulmonary:     Breath sounds: Normal breath sounds. No decreased breath sounds, wheezing, rhonchi or rales.  Abdominal:     Palpations: Abdomen is soft.     Tenderness: There is no abdominal tenderness.  Musculoskeletal:     Right lower leg: No swelling.     Left lower leg: No swelling.  Skin:    General: Skin is warm.     Findings: No rash.  Neurological:     Mental Status: She is alert and oriented to person, place, and time.      Scheduled Meds:  acetaminophen  1,000 mg Oral Q8H   Chlorhexidine Gluconate Cloth  6 each Topical Daily   docusate sodium  100 mg Oral BID   enoxaparin (LOVENOX) injection  40 mg Subcutaneous Q24H   fenofibrate  160 mg Oral Daily   ferrous fumarate-b12-vitamic C-folic acid   1 capsule Oral BID   pantoprazole  40 mg Oral Daily   Continuous Infusions:  methocarbamol (ROBAXIN) IV      Assessment/Plan:  Right hip fracture status post operative repair.  Continue pain control.  Continue working with physical therapy.  The plan will be outpatient physical therapy. Drop in hemoglobin.  Type and cross for tomorrow morning.  Continue to monitor closely. Hypokalemia and hypomagnesemia.  Replace magnesium IV and potassium orally.  Continue to hold off on hydrochlorothiazide Essential hypertension holding off hydrochlorothiazide.  BP normal Hypertriglyceridemia on fenofibrate GERD on PPI        Code Status:     Code Status Orders  (From admission, onward)           Start     Ordered   03/28/21 0312  Full code  Continuous        03/28/21 0312           Code Status History     This patient has a current code status but no historical code status.       Disposition Plan: Status is: Inpatient  Dispo: The patient is from: Home              Anticipated d/c is to: Home with outpatient physical therapy  Patient currently would like to work again with physical therapy tomorrow morning.  Postoperative day 2 hip fracture   Difficult to place patient.  No.  Time spent: 26 minutes  [name]

## 2021-03-30 NOTE — Progress Notes (Signed)
Physical Therapy Treatment Patient Details Name: Michele Bernard MRN: [id number] DOB: 1966-05-23 Today's Date: 03/30/2021    History of Present Illness Pt. is a 55 y.o. female who was admitted to [facility] for IM nailing repair of an Intertrochanteric Fx sustained during a fall with PMHx includes: GERD and hypertension as well as ongoing tobacco abuse.    PT Comments    Pt was more pain limited this afternoon and though she seemed to make good effort she was unable to ambulate even 10 feet and with increasing c/o pain and discomfort.  She was unable to lift LEs back up into bed and struggled more with exercises than she did this AM.  Pt clearly frustrated with her functional limitations and pain but remains determined to return home at d/c with assist from family.    Follow Up Recommendations   (per progress, pt still determined to go home and has 24/7 assist but has been functionally very limited)     Equipment Recommendations  Rolling walker with 5" wheels;3in1 (PT)    Recommendations for Other Services       Precautions / Restrictions Precautions Precautions: Fall Restrictions RLE Weight Bearing: Weight bearing as tolerated    Mobility  Bed Mobility Overal bed mobility: Needs Assistance Bed Mobility: Sit to Supine       Sit to supine: Min assist;Mod assist   General bed mobility comments: Pt unale to lift R LE enough to get it even close to returning to bed - even with cues/tricks given by PT heavy assist from PT    Transfers Overall transfer level: Modified independent Equipment used: Rolling walker (2 wheeled) Transfers: Sit to/from Stand Sit to Stand: Min guard         General transfer comment: Pt was able to rise, but was very guarded/pain limited needing to ease up to standing and clearly struggling with pain during the effort  Ambulation/Gait Ambulation/Gait assistance: Min assist Gait Distance (Feet): 6 Feet Assistive device: Rolling walker (2 wheeled)        General Gait Details: Pt with extremely guarded and slow ambulation.  Good effort but needing direct assist just to advance R LE on multiple steps and taking only very small steps with each laborious effort.  Pt initially motivated to try and do a longer walk but with increasing pain and fatigue ultimately had to abort prolonged walk and simply returned to bed.   Stairs Stairs: Yes Stairs assistance: Min guard Stair Management: Backwards;With walker Number of Stairs: 4 General stair comments: Despite her limitations during ambulation pt actually did relativley well with stair management.  She was able to negotiate up and down safely and with assist only for equipment management/safety   Wheelchair Mobility    Modified Rankin (Stroke Patients Only)       Balance Overall balance assessment: Needs assistance Sitting-balance support: No upper extremity supported;Feet supported Sitting balance-Leahy Scale: Good       Standing balance-Leahy Scale: Fair Standing balance comment: HEAVY reliance of UE's on RW due to R hip pain. No LOBs or balance concerns.                            Cognition Arousal/Alertness: Awake/alert Behavior During Therapy: WFL for tasks assessed/performed Overall Cognitive Status: Within Functional Limits for tasks assessed  Exercises General Exercises - Lower Extremity Ankle Circles/Pumps: AROM;Strengthening;10 reps Quad Sets: Strengthening;10 reps Short Arc Quad: AAROM;10 reps;AROM Heel Slides: Strengthening;10 reps;AROM;AAROM (with resisted leg ext) Hip ABduction/ADduction: AROM;10 reps;AAROM    General Comments General comments (skin integrity, edema, etc.): Despite coordianting session with pain meds she again hadvery limited tolerance, despite heavy encouargement about showing ability to safely go home, and her apparent willingness to push herself she could not tolerate much  functional standing tasks at all this afternoon      Pertinent Vitals/Pain Pain Assessment: 0-10 Pain Score: 7  (10/10 with activity) Pain Location: R thigh/quad    Home Living                      Prior Function            PT Goals (current goals can now be found in the care plan section) Progress towards PT goals: Progressing toward goals    Frequency    BID      PT Plan Current plan remains appropriate    Co-evaluation              AM-PAC PT "6 Clicks" Mobility   Outcome Measure  Help needed turning from your back to your side while in a flat bed without using bedrails?: A Little Help needed moving from lying on your back to sitting on the side of a flat bed without using bedrails?: A Little Help needed moving to and from a bed to a chair (including a wheelchair)?: A Little Help needed standing up from a chair using your arms (e.g., wheelchair or bedside chair)?: A Little Help needed to walk in hospital room?: A Lot Help needed climbing 3-5 steps with a railing? : A Lot 6 Click Score: 16    End of Session Equipment Utilized During Treatment: Gait belt Activity Tolerance: Patient limited by pain Patient left: with call bell/phone within reach;with bed alarm set Nurse Communication: Mobility status PT Visit Diagnosis: Pain;History of falling (Z91.81);Other abnormalities of gait and mobility (R26.89);Muscle weakness (generalized) (M62.81) Pain - Right/Left: Right Pain - part of body: Hip     Time: 1345-1415 PT Time Calculation (min) (ACUTE ONLY): 30 min  Charges:  $Gait Training: 8-22 mins $Therapeutic Exercise: 8-22 mins $Therapeutic Activity: 8-22 mins                     Malachi Pro, DPT  03/30/2021, 3:51 PM

## 2021-03-31 DIAGNOSIS — D62 Acute posthemorrhagic anemia: Secondary | ICD-10-CM

## 2021-03-31 LAB — BASIC METABOLIC PANEL
Anion gap: 4 — ABNORMAL LOW (ref 5–15)
BUN: 6 mg/dL (ref 6–20)
CO2: 28 mmol/L (ref 22–32)
Calcium: 8.9 mg/dL (ref 8.9–10.3)
Chloride: 108 mmol/L (ref 98–111)
Creatinine, Ser: 0.5 mg/dL (ref 0.44–1.00)
GFR, Estimated: >60 mL/min (ref >60)
Glucose, Bld: 122 mg/dL — ABNORMAL HIGH (ref 70–99)
Potassium: 4.3 mmol/L (ref 3.5–5.1)
Sodium: 140 mmol/L (ref 135–145)

## 2021-03-31 LAB — TYPE AND SCREEN
ABO/RH(D): A POS
Antibody Screen: NEGATIVE

## 2021-03-31 LAB — CBC
HCT: 24.3 % — ABNORMAL LOW (ref 36.0–46.0)
Hemoglobin: 8.3 g/dL — ABNORMAL LOW (ref 12.0–15.0)
MCH: 32.9 pg (ref 26.0–34.0)
MCHC: 34.2 g/dL (ref 30.0–36.0)
MCV: 96.4 fL (ref 80.0–100.0)
Platelets: 222 10*3/uL (ref 150–400)
RBC: 2.52 MIL/uL — ABNORMAL LOW (ref 3.87–5.11)
RDW: 13 % (ref 11.5–15.5)
WBC: 8 10*3/uL (ref 4.0–10.5)
nRBC: 0 % (ref 0.0–0.2)

## 2021-03-31 MED ORDER — FERROUS SULFATE 325 (65 FE) MG PO TABS: 325.0000 mg | ORAL_TABLET | Freq: Every day | ORAL | 0 refills | Status: AC

## 2021-03-31 MED ORDER — MAGNESIUM OXIDE -MG SUPPLEMENT 400 (240 MG) MG PO TABS: 400.0000 mg | ORAL_TABLET | Freq: Every day | ORAL | 0 refills | Status: DC

## 2021-03-31 MED ORDER — MAGNESIUM OXIDE -MG SUPPLEMENT 400 (240 MG) MG PO TABS
400.0000 mg | ORAL_TABLET | Freq: Every day | ORAL | Status: DC
  Administered 2021-03-31: 400 mg via ORAL
  Filled 2021-03-31: qty 1

## 2021-03-31 NOTE — TOC Progression Note (Signed)
Transition of Care [facility]) - Progression Note    Patient Details  Name: Michele Bernard MRN: [id number] Date of Birth: 1965-12-02  [facility] CM/SW Contact  Bing Quarry, RN Phone Number: 03/31/2021, 10:24 AM  Clinical Narrative:  Discharging today per provider. Per provider, patient needs assistance with Luvenox injections ordered by ortho providers on discharge. Prices compared using Good Rx coupon at [employer].where Rx's were sent. Also gave patient [facility] and [facility] information to seek on Tuesday when they open after Monday Holiday. Spoke with patient about how to download GoodRx app for possible discount. 12 syringes were approximately $100.00, but she can get as few as 2 at a time. Discussed importance of not lapsing on medication until she can see if she is able to get assistance from [facility]. She said she thought she would be able to afford the 12 syringes. Gave patient CM phone number if after reviewing information she has any questions. Patient had to attend to another matter and was going to call CM back to finish up and CM checking on other discharge issues. Gabriel Cirri RN CM     Expected Discharge Plan: OP Rehab Barriers to Discharge: Continued Medical Work up  Expected Discharge Plan and Services Expected Discharge Plan: OP Rehab   Discharge Planning Services: CM Consult   Living arrangements for the past 2 months: Single Family Home Expected Discharge Date: 03/31/21               DME Arranged: Dan Humphreys rolling, 3-N-1 DME Agency: [employer] Date DME Agency Contacted: 03/29/21 Time DME Agency Contacted: [id number] Representative spoke with at DME Agency: Juliette Alcide Arranged: NA           Social Determinants of Health (SDOH) Interventions    Readmission Risk Interventions No flowsheet data found.

## 2021-03-31 NOTE — Progress Notes (Signed)
Patient is A &O x4 and able to make needs known. AVS reviewed with patient who verbalized understanding via teach back re medications, follow up appointments, signs and symptoms to notify MD as well as limitations and restrictions. Patient was given printed prescriptions for enoxaparin, oxycodone and tramadol to have filled after discharge. Patient aware of ferrous sulfate and magnesium oxide being called into [facility]. Patient has BSC and RW at bedside that will go home with her.

## 2021-03-31 NOTE — Progress Notes (Signed)
Physical Therapy Treatment Patient Details Name: Michele Bernard MRN: [id number] DOB: 01/08/1966 Today's Date: 03/31/2021    History of Present Illness 55 y.o. female who was admitted to [facility] for IM nailing repair of an Intertrochanteric Fx sustained during a fall with PMHx includes: GERD and hypertension as well as ongoing tobacco abuse.    PT Comments    Pt was able to ambulate 15 ft and then ~25 ft today, but continues to be extremely slow and laborious with the effort.  Pt variably able to move/advance R LE actively and at times reaching down with R UE to help advance it.  She did show good effort but clearly in a lot of pain t/ot he entire session.  PT assures me that she does have adult help 24/7 available in the home and knows that she will be functionally limited, at least initially, at home.    Follow Up Recommendations  Home health PT;Follow surgeon's recommendation for DC plan and follow-up therapies     Equipment Recommendations  Rolling walker with 5" wheels;3in1 (PT)    Recommendations for Other Services       Precautions / Restrictions Precautions Precautions: Fall Restrictions Weight Bearing Restrictions: Yes RLE Weight Bearing: Weight bearing as tolerated    Mobility  Bed Mobility Overal bed mobility: Needs Assistance Bed Mobility: Sit to Supine     Supine to sit: Supervision;HOB elevated          Transfers Overall transfer level: Modified independent Equipment used: Rolling walker (2 wheeled) Transfers: Sit to/from Stand Sit to Stand: Min guard         General transfer comment: Pt able to rise from bed and later from commode w/o assist, though per her usual she was very pain limited/guarded and slow with the effort.  Ambulation/Gait Ambulation/Gait assistance: Min guard Gait Distance (Feet): 25 Feet Assistive device: Rolling walker (2 wheeled)       General Gait Details: Pt continues with extremely guarded and slow ambulation.  Good effort  but needing direct/self UE assist just to advance R LE on multiple steps and taking only very small steps with each laborious effort.  PT highly encouraging a longer walk into the hallway but pain became too much after after a walk of 15 ft and then ~25 ft she ultimately had to abort further/prolonged ambulation due to pain.   Stairs             Wheelchair Mobility    Modified Rankin (Stroke Patients Only)       Balance Overall balance assessment: Needs assistance Sitting-balance support: No upper extremity supported;Feet supported Sitting balance-Leahy Scale: Good     Standing balance support: Bilateral upper extremity supported;During functional activity Standing balance-Leahy Scale: Fair Standing balance comment: HEAVY reliance of UE's on RW due to R hip pain. No LOBs or balance concerns.                            Cognition Arousal/Alertness: Awake/alert Behavior During Therapy: WFL for tasks assessed/performed Overall Cognitive Status: Within Functional Limits for tasks assessed                                        Exercises      General Comments        Pertinent Vitals/Pain Pain Assessment: 0-10 Pain Score: 7  (increases with activity)  Pain Location: R thigh/quad    Home Living                      Prior Function            PT Goals (current goals can now be found in the care plan section) Progress towards PT goals: Progressing toward goals    Frequency    BID      PT Plan Current plan remains appropriate    Co-evaluation              AM-PAC PT "6 Clicks" Mobility   Outcome Measure  Help needed turning from your back to your side while in a flat bed without using bedrails?: A Little Help needed moving from lying on your back to sitting on the side of a flat bed without using bedrails?: A Little Help needed moving to and from a bed to a chair (including a wheelchair)?: A Little Help needed  standing up from a chair using your arms (e.g., wheelchair or bedside chair)?: A Little Help needed to walk in hospital room?: A Lot Help needed climbing 3-5 steps with a railing? : A Lot 6 Click Score: 16    End of Session Equipment Utilized During Treatment: Gait belt Activity Tolerance: Patient limited by pain Patient left: with call bell/phone within reach;with bed alarm set Nurse Communication: Mobility status PT Visit Diagnosis: Pain;History of falling (Z91.81);Other abnormalities of gait and mobility (R26.89);Muscle weakness (generalized) (M62.81) Pain - Right/Left: Right Pain - part of body: Hip     Time: 8588-5027 PT Time Calculation (min) (ACUTE ONLY): 34 min  Charges:  $Gait Training: 8-22 mins $Therapeutic Activity: 8-22 mins                     Malachi Pro, DPT 03/31/2021, 10:29 AM

## 2021-03-31 NOTE — Progress Notes (Signed)
  Subjective: 3 Days Post-Op Procedure(s) (LRB): INTRAMEDULLARY (IM) NAIL INTERTROCHANTRIC (Right) Patient reports pain as 7/10  Patient is well, and has had no acute complaints or problems Plan is to go Home after hospital stay. Negative for chest pain and shortness of breath Fever: no Gastrointestinal: Negative for nausea and vomiting  Objective: Vital signs in last 24 hours: Temp:  [97.9 F (36.6 C)-98.7 F (37.1 C)] 97.9 F (36.6 C) (09/04 0801) Pulse Rate:  [85-100] 85 (09/04 0801) Resp:  [16-19] 16 (09/04 0801) BP: (123-144)/(55-72) 127/71 (09/04 0801) SpO2:  [96 %-98 %] 96 % (09/04 0801)  Intake/Output from previous day: No intake or output data in the 24 hours ending 03/31/21 0846   Intake/Output this shift: No intake/output data recorded.  Labs: Recent Labs    03/29/21 0240 03/30/21 0431 03/31/21 0458  HGB 9.9* 8.1* 8.3*   Recent Labs    03/30/21 0431 03/31/21 0458  WBC 7.9 8.0  RBC 2.59* 2.52*  HCT 24.5* 24.3*  PLT 195 222   Recent Labs    03/30/21 0431 03/31/21 0458  NA 140 140  K 3.6 4.3  CL 108 108  CO2 28 28  BUN 6 6  CREATININE 0.51 0.50  GLUCOSE 156* 122*  CALCIUM 8.1* 8.9   No results for input(s): LABPT, INR in the last 72 hours.    EXAM General - Patient is Alert and Oriented Extremity - Neurovascular intact Sensation intact distally Dorsiflexion/Plantar flexion intact Compartment soft Dressing/Incision - clean, dry, scant blood tinged drainage Motor Function - intact, moving foot and toes well on exam.   Past Medical History:  Diagnosis Date   GERD (gastroesophageal reflux disease)    Hypertension     Assessment/Plan: 3 Days Post-Op Procedure(s) (LRB): INTRAMEDULLARY (IM) NAIL INTERTROCHANTRIC (Right) Principal Problem:   Closed fracture of right hip requiring operative repair [facility]) Active Problems:   Hypokalemia   Hypomagnesemia   Essential hypertension   Hypertriglyceridemia   GERD (gastroesophageal reflux  disease)   Drop in hemoglobin  Estimated body mass index is 21.49 kg/m as calculated from the following:   Height as of this encounter: 5\' 6"  (1.676 m).   Weight as of this encounter: 60.4 kg. Advance diet Up with therapy Positive bowel movement  Acute postop blood loss anemia -hemoglobin 8.3, stable.  Continue with iron supplement  Care management to assist with discharge to home with home health PT pending completion of PT goals.  Most likely will possibly discharge home tomorrow given current progress with PT.  DVT Prophylaxis - Lovenox and TED hose Weight-Bearing as tolerated to right leg  T. PA-C Orthopaedic Surgery 03/31/2021, 8:46 AM

## 2021-03-31 NOTE — Plan of Care (Signed)
No acute events during the night. Pain controlled with PRN oxycodone. Dressing dry and intact. Safety maintained.  Problem: Education: Goal: Knowledge of General Education information will improve Description: Including pain rating scale, medication(s)/side effects and non-pharmacologic comfort measures Outcome: Progressing   Problem: Health Behavior/Discharge Planning: Goal: Ability to manage health-related needs will improve Outcome: Progressing   Problem: Clinical Measurements: Goal: Ability to maintain clinical measurements within normal limits will improve Outcome: Progressing Goal: Will remain free from infection Outcome: Progressing Goal: Diagnostic test results will improve Outcome: Progressing Goal: Respiratory complications will improve Outcome: Progressing Goal: Cardiovascular complication will be avoided Outcome: Progressing   Problem: Activity: Goal: Risk for activity intolerance will decrease Outcome: Progressing   Problem: Nutrition: Goal: Adequate nutrition will be maintained Outcome: Progressing   Problem: Coping: Goal: Level of anxiety will decrease Outcome: Progressing   Problem: Elimination: Goal: Will not experience complications related to bowel motility Outcome: Progressing Goal: Will not experience complications related to urinary retention Outcome: Progressing   Problem: Pain Managment: Goal: General experience of comfort will improve Outcome: Progressing   Problem: Safety: Goal: Ability to remain free from injury will improve Outcome: Progressing   Problem: Skin Integrity: Goal: Risk for impaired skin integrity will decrease Outcome: Progressing   Problem: Education: Goal: Verbalization of understanding the information provided (i.e., activity precautions, restrictions, etc) will improve Outcome: Progressing Goal: Individualized Educational Video(s) Outcome: Progressing   Problem: Activity: Goal: Ability to ambulate and perform ADLs  will improve Outcome: Progressing   Problem: Clinical Measurements: Goal: Postoperative complications will be avoided or minimized Outcome: Progressing   Problem: Self-Concept: Goal: Ability to maintain and perform role responsibilities to the fullest extent possible will improve Outcome: Progressing   Problem: Pain Management: Goal: Pain level will decrease Outcome: Progressing

## 2021-03-31 NOTE — Discharge Summary (Signed)
[facility] [place] at [facility]   PATIENT NAME: Michele Bernard    MR#:  [id number]  DATE OF BIRTH:  22-Apr-1966  DATE OF ADMISSION:  03/28/2021 ADMITTING PHYSICIAN: Hannah Beat, MD  DATE OF DISCHARGE: 03/31/2021  1:30 PM  PRIMARY CARE PHYSICIAN: [facility]    ADMISSION DIAGNOSIS:  Hypokalemia [E87.6] Surgery, elective [Z41.9] Fall [W19.XXXA] Closed right hip fracture (HCC) [S72.001A] Fall, initial encounter [W19.XXXA] Closed displaced intertrochanteric fracture of right femur, initial encounter (HCC) [S72.141A]  DISCHARGE DIAGNOSIS:  Principal Problem:   Closed fracture of right hip requiring operative repair [facility]) Active Problems:   Hypokalemia   Hypomagnesemia   Essential hypertension   Hypertriglyceridemia   GERD (gastroesophageal reflux disease)   Drop in hemoglobin   SECONDARY DIAGNOSIS:   Past Medical History:  Diagnosis Date   GERD (gastroesophageal reflux disease)    Hypertension     HOSPITAL COURSE:   Right hip fracture status post operative repair by Dr. Allena Katz on 03/28/2021.  Patient had intramedullary nailing of the right femur with cephalomedullary device.  The patient was knocked over by an unleashed dog and found to have a right hip fracture.  Orthopedic prescribed Lovenox injections for 14 days and pain control.  Outpatient physical therapy form signed. Acute blood loss anemia.  Hemoglobin went from 12.5 down to 8.3 but stabilized.  Iron prescribed on discharge. Hypokalemia and hypomagnesemia these were replaced during the hospital course with IV magnesium and oral potassium.  Continue to hold hydrochlorothiazide.  Oral magnesium prescribed upon discharge Essential hypertension.  Holding off hydrochlorothiazide.  Blood pressure normal Hypertriglyceridemia on fenofibrate GERD on PPI  DISCHARGE CONDITIONS:   Satisfactory  CONSULTS OBTAINED:  Treatment Team:  Signa Kell, MD  DRUG ALLERGIES:   Allergies   Allergen Reactions   Penicillins Hives   Ancef [Cefazolin] Rash    DISCHARGE MEDICATIONS:   Allergies as of 03/31/2021       Reactions   Penicillins Hives   Ancef [cefazolin] Rash        Medication List     STOP taking these medications    hydrochlorothiazide 25 MG tablet Commonly known as: HYDRODIURIL       TAKE these medications    albuterol 108 (90 Base) MCG/ACT inhaler Commonly known as: VENTOLIN HFA Inhale 2 puffs into the lungs every 4 (four) hours as needed for wheezing or shortness of breath.   celecoxib 200 MG capsule Commonly known as: CELEBREX Take 200 mg by mouth 2 (two) times daily.   enoxaparin 40 MG/0.4ML injection Commonly known as: LOVENOX Inject 0.4 mLs (40 mg total) into the skin daily for 14 days.   fenofibrate 145 MG tablet Commonly known as: TRICOR Take 145 mg by mouth daily.   ferrous sulfate 325 (65 FE) MG tablet Take 1 tablet (325 mg total) by mouth daily.   Fish Oil 1000 MG Caps Take 1 capsule by mouth 2 (two) times daily.   magnesium oxide 400 (240 Mg) MG tablet Commonly known as: MAG-OX Take 1 tablet (400 mg total) by mouth daily. Start taking on: April 01, 2021   omeprazole 40 MG capsule Commonly known as: PRILOSEC Take 40 mg by mouth daily.   oxyCODONE 5 MG immediate release tablet Commonly known as: Oxy IR/ROXICODONE Take 0.5-1 tablets (2.5-5 mg total) by mouth every 4 (four) hours as needed for moderate pain (pain score 4-6).   traMADol 50 MG tablet Commonly known as: ULTRAM Take 1 tablet (50 mg total) by  mouth every 6 (six) hours as needed for moderate pain.               Durable Medical Equipment  (From admission, onward)           Start     Ordered   03/29/21 1633  For home use only DME 3 n 1  Once        03/29/21 1632   03/29/21 1602  For home use only DME Walker rolling  Once       Question Answer Comment  Walker: With 5 Inch Wheels   Patient needs a walker to treat with the following  condition Weakness      03/29/21 1601             DISCHARGE INSTRUCTIONS:   Follow-up your PMD 5 days Follow-up orthopedics 2 weeks  If you experience worsening of your admission symptoms, develop shortness of breath, life threatening emergency, suicidal or homicidal thoughts you must seek medical attention immediately by calling 911 or calling your MD immediately  if symptoms less severe.  You Must read complete instructions/literature along with all the possible adverse reactions/side effects for all the Medicines you take and that have been prescribed to you. Take any new Medicines after you have completely understood and accept all the possible adverse reactions/side effects.   Please note  You were cared for by a hospitalist during your hospital stay. If you have any questions about your discharge medications or the care you received while you were in the hospital after you are discharged, you can call the unit and asked to speak with the hospitalist on call if the hospitalist that took care of you is not available. Once you are discharged, your primary care physician will handle any further medical issues. Please note that NO REFILLS for any discharge medications will be authorized once you are discharged, as it is imperative that you return to your primary care physician (or establish a relationship with a primary care physician if you do not have one) for your aftercare needs so that they can reassess your need for medications and monitor your lab values.    Today   CHIEF COMPLAINT:   Chief Complaint  Patient presents with   Fall    HISTORY OF PRESENT ILLNESS:  Michele Bernard  is a 55 y.o. female came in after being knocked over by an unleashed dog and found to have a right hip fracture   VITAL SIGNS:  Blood pressure (!) 112/59, pulse 77, temperature 98.8 F (37.1 C), resp. rate 16, height 5\' 6"  (1.676 m), weight 60.4 kg, SpO2 95 %.   PHYSICAL EXAMINATION:   GENERAL:  55 y.o.-year-old patient lying in the bed with no acute distress.  EYES: Pupils equal, round, reactive to light and accommodation. No scleral icterus.  HEENT: Head atraumatic, normocephalic. Oropharynx and nasopharynx clear.  LUNGS: Normal breath sounds bilaterally, no wheezing, rales,rhonchi or crepitation. No use of accessory muscles of respiration.  CARDIOVASCULAR: S1, S2 normal. No murmurs, rubs, or gallops.  ABDOMEN: Soft, non-tender, non-distended.  EXTREMITIES: No pedal edema.  NEUROLOGIC: Cranial nerves II through XII are intact.  Seen walking very slowly with physical therapy in the hallway. PSYCHIATRIC: The patient is alert and oriented x 3.  SKIN: No obvious rash, lesion, or ulcer.   DATA REVIEW:   CBC Recent Labs  Lab 03/31/21 0458  WBC 8.0  HGB 8.3*  HCT 24.3*  PLT 222    Chemistries  Recent Labs  Lab 03/30/21 0431 03/31/21 0458  NA 140 140  K 3.6 4.3  CL 108 108  CO2 28 28  GLUCOSE 156* 122*  BUN 6 6  CREATININE 0.51 0.50  CALCIUM 8.1* 8.9  MG 1.6*  --     Microbiology Results  Results for orders placed or performed during the hospital encounter of 03/28/21  Resp Panel by RT-PCR (Flu A&B, Covid) Nasopharyngeal Swab     Status: None   Collection Time: 03/28/21  2:03 AM   Specimen: Nasopharyngeal Swab; Nasopharyngeal(NP) swabs in vial transport medium  Result Value Ref Range Status   SARS Coronavirus 2 by RT PCR NEGATIVE NEGATIVE Final    Comment: (NOTE) SARS-CoV-2 target nucleic acids are NOT DETECTED.  The SARS-CoV-2 RNA is generally detectable in upper respiratory specimens during the acute phase of infection. The lowest concentration of SARS-CoV-2 viral copies this assay can detect is 138 copies/mL. A negative result does not preclude SARS-Cov-2 infection and should not be used as the sole basis for treatment or other patient management decisions. A negative result may occur with  improper specimen collection/handling, submission of  specimen other than nasopharyngeal swab, presence of viral mutation(s) within the areas targeted by this assay, and inadequate number of viral copies(<138 copies/mL). A negative result must be combined with clinical observations, patient history, and epidemiological information. The expected result is Negative.  Fact Sheet for Patients:  BloggerCourse.com  Fact Sheet for Healthcare Providers:  SeriousBroker.it  This test is no t yet approved or cleared by the [place] FDA and  has been authorized for detection and/or diagnosis of SARS-CoV-2 by FDA under an Emergency Use Authorization (EUA). This EUA will remain  in effect (meaning this test can be used) for the duration of the COVID-19 declaration under Section 564(b)(1) of the Act, 21 U.S.C.section 360bbb-3(b)(1), unless the authorization is terminated  or revoked sooner.       Influenza A by PCR NEGATIVE NEGATIVE Final   Influenza B by PCR NEGATIVE NEGATIVE Final    Comment: (NOTE) The Xpert Xpress SARS-CoV-2/FLU/RSV plus assay is intended as an aid in the diagnosis of influenza from Nasopharyngeal swab specimens and should not be used as a sole basis for treatment. Nasal washings and aspirates are unacceptable for Xpert Xpress SARS-CoV-2/FLU/RSV testing.  Fact Sheet for Patients: BloggerCourse.com  Fact Sheet for Healthcare Providers: SeriousBroker.it  This test is not yet approved or cleared by the [place] FDA and has been authorized for detection and/or diagnosis of SARS-CoV-2 by FDA under an Emergency Use Authorization (EUA). This EUA will remain in effect (meaning this test can be used) for the duration of the COVID-19 declaration under Section 564(b)(1) of the Act, 21 U.S.C. section 360bbb-3(b)(1), unless the authorization is terminated or revoked.  Performed at [facility], [address], [place], [place] [address]       Management plans discussed with the patient, and she is in agreement.  CODE STATUS:     Code Status Orders  (From admission, onward)           Start     Ordered   03/28/21 0312  Full code  Continuous        03/28/21 0312           Code Status History     This patient has a current code status but no historical code status.       TOTAL TIME TAKING CARE OF THIS PATIENT: 32 minutes.    Shantele Reller [employer]  M.D on 03/31/2021 at 4:36 PM   [facility]  CC: Primary care physician; [facility]

## 2021-05-02 ENCOUNTER — Ambulatory Visit: Payer: Self-pay | Attending: Orthopedic Surgery

## 2021-05-02 DIAGNOSIS — R262 Difficulty in walking, not elsewhere classified: Secondary | ICD-10-CM | POA: Insufficient documentation

## 2021-05-02 DIAGNOSIS — M25651 Stiffness of right hip, not elsewhere classified: Secondary | ICD-10-CM | POA: Insufficient documentation

## 2021-05-02 NOTE — Therapy (Signed)
[place] [facility] [address] [place], [address] Phone: [phone]   Fax:  [phone]  Physical Therapy Evaluation  Patient Details  Name: Michele Bernard MRN: [id number] Date of Birth: 02/29/1966 Referring Provider (PT): Dedra Skeens, PA-C (Signa Kell, MD (ortho))   Encounter Date: 05/02/2021   PT End of Session - 05/02/21 1052     Visit Number 1    Number of Visits 16    Date for PT Re-Evaluation 06/27/21    Authorization Type Self pay, no insurance: medicaid application in process    Authorization Time Period 05/02/21-06/27/21    Progress Note Due on Visit 10    PT Start Time 0935    PT Stop Time 1017    PT Time Calculation (min) 42 min    Equipment Utilized During Treatment Gait belt    Activity Tolerance Patient tolerated treatment well;No increased pain    Behavior During Therapy WFL for tasks assessed/performed             Past Medical History:  Diagnosis Date   GERD (gastroesophageal reflux disease)    Hypertension     Past Surgical History:  Procedure Laterality Date   BLADDER SURGERY     Bladder tact   BREAST BIOPSY Left 07/03/2016   path pending   INTRAMEDULLARY (IM) NAIL INTERTROCHANTERIC Right 03/28/2021   Procedure: INTRAMEDULLARY (IM) NAIL INTERTROCHANTRIC;  Surgeon: Signa Kell, MD;  Location: [facility] ORS;  Service: Orthopedics;  Laterality: Right;    There were no vitals filed for this visit.    Subjective Assessment - 05/02/21 0853     Subjective Michele Bernard is a 55yoF who comes to [facility] OPPT evaluation s/p fall onto Right hip, fracture, and IM nailing on 9/1. Pt DC to home, no home services, has been working on HEP given at hospital, progressing time on feet gradually with RW, began integrating SPC use on post op week 4.    How long can you sit comfortably? Pt reports leg falls asleep after 10-15 minutes sitting; (also posterior to lateral hip halfway to knee numb)    How long  can you stand comfortably? 5 - 10 minutes with RW    How long can you walk comfortably? limited community distance AMB c RW    Currently in Pain? Yes    Pain Score 5     Pain Location --   Rt posterior hip from trochanter to L4/L5 area.   Pain Descriptors / Indicators Aching    Aggravating Factors  walking, sitting    Pain Relieving Factors tylenol, oxycodone, activity modification                [facility] PT Assessment - 05/02/21 0001       Assessment   Medical Diagnosis Rt hip IM nailing post fall/fracture    Referring Provider (PT) Dedra Skeens, PA-C   Signa Kell, MD (ortho)   Onset Date/Surgical Date 03/28/21    Hand Dominance Right    Next MD Visit 05/14/21 c ortho    Prior Therapy acute PT only      Precautions   Precautions None      Restrictions   Weight Bearing Restrictions Yes    RLE Weight Bearing Weight bearing as tolerated      Balance Screen   Has the patient fallen in the past 6 months Yes    How many times? 1    Has the patient had a decrease in activity  level because of a fear of falling?  No    Is the patient reluctant to leave their home because of a fear of falling?  No      Home Tourist information centre manager residence    Living Arrangements Children;Other relatives   Son, GrandDTR   Home Access Stairs to enter    Entrance Stairs-Number of Steps 2    Entrance Stairs-Rails Right    Home Layout One level      Prior Function   Level of Independence Independent with basic ADLs   sometimes needs GDTR to help in/out of shower, sits to bathe, occasional assist with socks     Observation/Other Assessments   Focus on Therapeutic Outcomes (FOTO)  34      ROM / Strength   AROM / PROM / Strength PROM      PROM   PROM Assessment Site Hip    Right/Left Hip Right;Left    Right Hip Flexion 109    Right Hip External Rotation  15    Right Hip Internal Rotation  34    Right Hip ABduction 18   pain at anterior hip and posterior hip; nonpainful  groin hypertonicity   Left Hip Flexion 110    Left Hip External Rotation  42    Left Hip Internal Rotation  38    Left Hip ABduction 33      Transfers   Five time sit to stand comments  15sec hands free    Transfer via Lift Equipment --   RW for safety            -Sustained release to Rt adductor longus, rt posterior gluteals -seated LAQ x15 -standing heel raises with walker -STS x10    Objective measurements completed on examination: See above findings.         PT Education - 05/02/21 1258     Education Details Smoking cessation and smoking's impact on bone metabolism and healing timelines    Person(s) Educated Patient    Methods Explanation    Comprehension Verbalized understanding              PT Short Term Goals - 05/02/21 1301       PT SHORT TERM GOAL #1   Title After 4 weeks pt will improve FOTO score by 10 points indicative of improved perceived ease in performing basic mobility for ADL and IADL.    Baseline Evaluation: 34    Time 4    Period Weeks    Status New    Target Date 05/30/21      PT SHORT TERM GOAL #2   Title In 4 weeks patient to demonstrate improved 5 times STS hands-free less than 12 seconds to demonstrate improved power in the BLE    Baseline Evaluation: 15 seconds hands-free    Time 4    Period Weeks    Status New    Target Date 05/30/21               PT Long Term Goals - 05/02/21 1304       PT LONG TERM GOAL #1   Title Patient to demonstrate 5 times STS less than 10 seconds to demonstrate BLE power equal to population norm values.    Baseline Eval: 15sec hands free    Time 8    Period Weeks    Status New    Target Date 06/27/21      PT LONG TERM GOAL #2  Title Patient to demonstrate tolerance of 34M WT greater than 1400 feet with SPC or L RAD to show progression back to baseline mobility    Baseline Evaluation: Unable to tolerate distances outside of the home without RW    Time 8    Period Weeks    Status New     Target Date 06/27/21      PT LONG TERM GOAL #3   Title Patient to demonstrate MMT 5/5 in right hip flexion, hip abduction, hip extension, hip ER, hip IR.    Baseline Evaluation: Unable to test due to pain    Time 8    Period Weeks    Status New    Target Date 06/27/21                    Plan - 05/02/21 1055     Clinical Impression Statement Pt presenting for evaluation s/p Rt hip fracture and IM nailing 03/28/21. Examination revealing of ROM restrictions, pain at end range of hip, weakness in 3 planes, muscles spasm about the hip/kee, difficulty with basic mobility, and inability to perform baseline ADL, IADL, and leisure. Pt will benefit from skilled PT activity to address deficits and impiarment in order to maximize return to tolerance and indepenence of moblity required for ADL/IADL, and leisure.    Examination-Activity Limitations Bathing;Locomotion Level;Transfers;Bed Mobility;Carry;Bend;Sit;Stand;Squat    Examination-Participation Restrictions Cleaning;Meal Prep;Community Activity;Yard Work;Shop    Stability/Clinical Decision Making Stable/Uncomplicated    Clinical Decision Making Low    Rehab Potential Good    PT Frequency 2x / week    PT Duration 8 weeks    PT Treatment/Interventions ADLs/Self Care Home Management;Cryotherapy;Electrical Stimulation;Moist Heat;Gait training;Stair training;Functional mobility training;Therapeutic activities;Therapeutic exercise;Balance training;Patient/family education;Manual techniques;Passive range of motion;Dry needling    PT Next Visit Plan Review HEP; MMT, for endurance and gait analysis    PT Home Exercise Plan LAQ, Heel Slides, STS from chair, Standing heel raises    Consulted and Agree with Plan of Care Patient             Patient will benefit from skilled therapeutic intervention in order to improve the following deficits and impairments:  Abnormal gait, Decreased balance, Decreased endurance, Decreased mobility,  Difficulty walking, Hypomobility, Increased muscle spasms, Decreased activity tolerance, Decreased knowledge of precautions, Decreased range of motion, Decreased strength  Visit Diagnosis: Stiffness of right hip, not elsewhere classified  Difficulty in walking, not elsewhere classified     Problem List Patient Active Problem List   Diagnosis Date Noted   Acute blood loss anemia    Drop in hemoglobin    Closed fracture of right hip requiring operative repair (HCC) 03/28/2021   Hypokalemia 03/28/2021   Hypomagnesemia 03/28/2021   Essential hypertension 03/28/2021   Hypertriglyceridemia 03/28/2021   GERD (gastroesophageal reflux disease) 03/28/2021   Urethral caruncle 04/19/2016   1:10 PM, 05/02/21 Rosamaria Lints, PT, DPT Physical Therapist - [place] [phone] (Office)   Kosse C, PT 05/02/2021, 1:09 PM   [facility] [address], [place], [address] Phone: [phone]   Fax:  [phone]  Name: Michele Bernard MRN: [id number] Date of Birth: 1966/03/06

## 2021-05-07 ENCOUNTER — Ambulatory Visit: Payer: Self-pay

## 2021-05-07 DIAGNOSIS — R262 Difficulty in walking, not elsewhere classified: Secondary | ICD-10-CM

## 2021-05-07 DIAGNOSIS — M25651 Stiffness of right hip, not elsewhere classified: Secondary | ICD-10-CM

## 2021-05-07 NOTE — Therapy (Signed)
[facility] [address], [place], [address] Phone: [phone]   Fax:  [phone]  Physical Therapy Treatment  Patient Details  Name: Michele Bernard MRN: [id number] Date of Birth: 11/17/1965 Referring Provider (PT): Dedra Skeens, PA-C (Signa Kell, MD (ortho))   Encounter Date: 05/07/2021   PT End of Session - 05/07/21 0858     Visit Number 2    Number of Visits 16    Date for PT Re-Evaluation 06/27/21    Authorization Type Self pay, no insurance: medicaid application in process    Authorization Time Period 05/02/21-06/27/21    PT Start Time 0853    PT Stop Time 0925    PT Time Calculation (min) 32 min    Equipment Utilized During Treatment Gait belt    Activity Tolerance Patient tolerated treatment well;No increased pain    Behavior During Therapy WFL for tasks assessed/performed             Past Medical History:  Diagnosis Date   GERD (gastroesophageal reflux disease)    Hypertension     Past Surgical History:  Procedure Laterality Date   BLADDER SURGERY     Bladder tact   BREAST BIOPSY Left 07/03/2016   path pending   INTRAMEDULLARY (IM) NAIL INTERTROCHANTERIC Right 03/28/2021   Procedure: INTRAMEDULLARY (IM) NAIL INTERTROCHANTRIC;  Surgeon: Signa Kell, MD;  Location: [facility] ORS;  Service: Orthopedics;  Laterality: Right;    There were no vitals filed for this visit.   Subjective Assessment - 05/07/21 0856     Subjective DOing well today. Pain 8/10 Rt knee, worse with cold front. Saw MD yesterday for mouth sores, on 2 new meds temporarily.    Pertinent History Michele Bernard is a 55yoF who comes to [facility] OPPT evaluation s/p fall onto Right hip, fracture, and IM nailing on 9/1. Pt DC to home, no home services, has been working on HEP given at hospital, progressing time on feet gradually with RW, began integrating SPC use on post op week 4.    Currently in Pain? Yes    Pain Score 8     Pain  Location --   Right knee joint               [facility] PT Assessment - 05/07/21 0001       ROM / Strength   AROM / PROM / Strength Strength      Strength   Strength Assessment Site Hip;Knee;Ankle    Right/Left Hip Right;Left    Right Hip External Rotation  4+/5    Right Hip Internal Rotation 4+/5    Right Hip ABduction --   horizontal ABDCT: 5/5   Left Hip Flexion 4/5    Left Hip External Rotation 3+/5    Left Hip Internal Rotation 3+/5    Left Hip ABduction --   horizontal ABDCT: 4/5   Right/Left Knee Right;Left    Right Knee Flexion 5/5    Right Knee Extension 4+/5    Left Knee Flexion 5/5    Left Knee Extension 5/5    Right/Left Ankle Right;Left      Ambulation/Gait   Ambulation Distance (Feet) 285 Feet   , RW; SOB            INTERVENTION THIS DATE: - , MMT  Set 1 -RLE LAQ 2x15 3lb AW  -YellowTB band clam, green ball (green and red too restrictive)   -RLE LAQ 2x15 3lb AW  -YellowTB band clam,  green ball (green and red too restrictive)   Set 2  -Chair rotation stretch 30sec bila (ball between feet)  -Bilar Hip IR seated, YellowTB at malleolus, green ball between knees  -seated RLE flexion + slight abduction 1x12  -Chair rotation stretch 30sec bilat (ball between feet) -Bilar Hip IR seated, YellowTB at malleolus, green ball between knees  -seated RLE flexion + slight abduction 1x12    *reduced knee pain at end of session.         PT Short Term Goals - 05/02/21 1301       PT SHORT TERM GOAL #1   Title After 4 weeks pt will improve FOTO score by 10 points indicative of improved perceived ease in performing basic mobility for ADL and IADL.    Baseline Evaluation: 34    Time 4    Period Weeks    Status New    Target Date 05/30/21      PT SHORT TERM GOAL #2   Title In 4 weeks patient to demonstrate improved 5 times STS hands-free less than 12 seconds to demonstrate improved power in the BLE    Baseline Evaluation: 15 seconds hands-free     Time 4    Period Weeks    Status New    Target Date 05/30/21               PT Long Term Goals - 05/02/21 1304       PT LONG TERM GOAL #1   Title Patient to demonstrate 5 times STS less than 10 seconds to demonstrate BLE power equal to population norm values.    Baseline Eval: 15sec hands free    Time 8    Period Weeks    Status New    Target Date 06/27/21      PT LONG TERM GOAL #2   Title Patient to demonstrate tolerance of 68M WT greater than 1400 feet with SPC or L RAD to show progression back to baseline mobility    Baseline Evaluation: Unable to tolerate distances outside of the home without RW    Time 8    Period Weeks    Status New    Target Date 06/27/21      PT LONG TERM GOAL #3   Title Patient to demonstrate MMT 5/5 in right hip flexion, hip abduction, hip extension, hip ER, hip IR.    Baseline Evaluation: Unable to test due to pain    Time 8    Period Weeks    Status New    Target Date 06/27/21                   Plan - 05/07/21 5366     Clinical Impression Statement Continued with current plan of care as laid out in evaluation and recent prior sessions. All interventions tolerated as expected by author. Mobility and strength continue to progress as anticipated. Recovery intervals given as needed based on signs of exertion and/or pt request. Pt educated on best technique for each intervention- author uses verbal, visual, tactile cues to optimize learning. Author takes steps to maximize patient independence when appropriate. Pt remains highly motivated. Pt closely monitored throughout session fto maximize patient safety during interventions. The patient's therapy prognosis indicates continued potential for improvement, anticipate that future progress is attainable in a reasonable/predictable timeframe. Maximum improvement is within reach. Pt will continue to benefit from skilled PT services to address deficits and impairment identified in evaluation in  order to maximize independence  and safety in basic mobility required for performance of ADL, IADL, and leisure.    Examination-Activity Limitations Bend    Examination-Participation Restrictions Cleaning;Meal Prep;Community Activity;Yard Work;Shop    Stability/Clinical Decision Making Stable/Uncomplicated    Clinical Decision Making Low    Rehab Potential Good    PT Frequency 2x / week    PT Duration 8 weeks    PT Treatment/Interventions ADLs/Self Care Home Management;Cryotherapy;Electrical Stimulation;Moist Heat;Gait training;Stair training;Functional mobility training;Therapeutic activities;Therapeutic exercise;Balance training;Patient/family education;Manual techniques;Passive range of motion;Dry needling    PT Next Visit Plan Continue with low to moderate RLE resistance training    PT Home Exercise Plan LAQ, Heel Slides, STS from chair, Standing heel raises    Consulted and Agree with Plan of Care Patient             Patient will benefit from skilled therapeutic intervention in order to improve the following deficits and impairments:  Abnormal gait, Decreased balance, Decreased endurance, Decreased mobility, Difficulty walking, Hypomobility, Increased muscle spasms, Decreased activity tolerance, Decreased knowledge of precautions, Decreased range of motion, Decreased strength  Visit Diagnosis: Stiffness of right hip, not elsewhere classified  Difficulty in walking, not elsewhere classified     Problem List Patient Active Problem List   Diagnosis Date Noted   Acute blood loss anemia    Drop in hemoglobin    Closed fracture of right hip requiring operative repair (HCC) 03/28/2021   Hypokalemia 03/28/2021   Hypomagnesemia 03/28/2021   Essential hypertension 03/28/2021   Hypertriglyceridemia 03/28/2021   GERD (gastroesophageal reflux disease) 03/28/2021   Urethral caruncle 04/19/2016   9:30 AM, 05/07/21 Rosamaria Lints, PT, DPT Physical Therapist - [facility] [phone] (Office)   [place] C, PT 05/07/2021, 9:21 AM   [facility] [address] [place], [address] Phone: [phone]   Fax:  [phone]  Name: Michele Bernard MRN: [id number] Date of Birth: [birth year]

## 2021-05-09 ENCOUNTER — Telehealth: Payer: Self-pay

## 2021-05-09 ENCOUNTER — Ambulatory Visit: Payer: Self-pay | Admitting: Physical Therapy

## 2021-05-09 NOTE — Telephone Encounter (Signed)
Pt did not show for appointment scheduled this morning. Author reached out via telephone. Pt reports she was occupied by attending to some personal matters this morning and unable to make it. Pt is aware of her next appointment and has confirmed she plans to attend.   2:41 PM, 05/09/21 Rosamaria Lints, PT, DPT Physical Therapist - [place] [phone] (Office)

## 2021-05-14 ENCOUNTER — Ambulatory Visit: Payer: Self-pay | Admitting: Physical Therapy

## 2021-05-14 DIAGNOSIS — M25651 Stiffness of right hip, not elsewhere classified: Secondary | ICD-10-CM

## 2021-05-14 DIAGNOSIS — R262 Difficulty in walking, not elsewhere classified: Secondary | ICD-10-CM

## 2021-05-14 NOTE — Therapy (Signed)
[facility] [address], [place], [address] Phone: [phone]   Fax:  [phone]  Physical Therapy Treatment  Patient Details  Name: Michele Bernard MRN: [id number] Date of Birth: 06-14-1966 Referring Provider (PT): Dedra Skeens, PA-C (Signa Kell, MD (ortho))   Encounter Date: 05/14/2021   PT End of Session - 05/14/21 1418     Visit Number 3    Number of Visits 16    Date for PT Re-Evaluation 06/27/21    Authorization Type Self pay, no insurance: medicaid application in process    Authorization Time Period 05/02/21-06/27/21    PT Start Time 1330    PT Stop Time 1415    PT Time Calculation (min) 45 min    Equipment Utilized During Treatment Gait belt    Activity Tolerance Patient tolerated treatment well;No increased pain    Behavior During Therapy WFL for tasks assessed/performed             Past Medical History:  Diagnosis Date   GERD (gastroesophageal reflux disease)    Hypertension     Past Surgical History:  Procedure Laterality Date   BLADDER SURGERY     Bladder tact   BREAST BIOPSY Left 07/03/2016   path pending   INTRAMEDULLARY (IM) NAIL INTERTROCHANTERIC Right 03/28/2021   Procedure: INTRAMEDULLARY (IM) NAIL INTERTROCHANTRIC;  Surgeon: Signa Kell, MD;  Location: [facility] ORS;  Service: Orthopedics;  Laterality: Right;    There were no vitals filed for this visit.   Subjective Assessment - 05/14/21 1332     Subjective Pt reports that she is doing well with exercises but she is experiencing some pain in left hip.    Pertinent History Michele Bernard is a 55yoF who comes to [facility] OPPT evaluation s/p fall onto Right hip, fracture, and IM nailing on 9/1. Pt DC to home, no home services, has been working on HEP given at hospital, progressing time on feet gradually with RW, began integrating SPC use on post op week 4.    Currently in Pain? Yes    Pain Score 4     Pain Location Hip    Pain  Orientation Left    Pain Descriptors / Indicators Aching            THEREX:  Standing Marches with BUE support using RW 3 x 10   Standing Hip Abduction with BUE support using RW 3 x 10   Standing HS Curls with BUE support using RW 3 x 10   Standing Heel Raises with BUE support using RW 3 x 10   Standing Mini-Squat with BUE  3 x 10  -Mod vc to place weight back onto heels and avoid knee going over toes  -Visual input of table behind patient to imagine sitting on.   Updated HEP and educated patient on changes to exercises and addition of new exercises   Gait Training  -Use of spc and step to and step through gait patterns  -Fitted cane to proper height and instructed pt to keep on strong side.      Plan - 05/14/21 1424     Clinical Impression Statement Pt presents for f/u for s/p 6 weeks right hip fracture. She demonstrates an improvement in hip strength with ability to perform standing hip strengthening exercises without an increase pain. She was also able to utilize [facility] for ambulation without an increase in her pain. 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    Examination-Activity Limitations Bend    Examination-Participation Restrictions Cleaning;Meal Prep;Community Activity;Yard Work;Shop    Stability/Clinical Decision Making Stable/Uncomplicated    Rehab Potential Good    PT Frequency 2x / week    PT Duration 8 weeks    PT Treatment/Interventions ADLs/Self Care Home Management;Cryotherapy;Electrical Stimulation;Moist Heat;Gait training;Stair training;Functional mobility training;Therapeutic activities;Therapeutic exercise;Balance training;Patient/family education;Manual techniques;Passive range of motion;Dry needling    PT Next Visit Plan  Continue with low to moderate RLE resistance training    PT Home Exercise Plan [email or web address]    Consulted and Agree with Plan of Care Patient                       PT Short Term Goals - 05/02/21 1301       PT SHORT TERM GOAL #1   Title After 4 weeks pt will improve FOTO score by 10 points indicative of improved perceived ease in performing basic mobility for ADL and IADL.    Baseline Evaluation: 34    Time 4    Period Weeks    Status New    Target Date 05/30/21      PT SHORT TERM GOAL #2   Title In 4 weeks patient to demonstrate improved 5 times STS hands-free less than 12 seconds to demonstrate improved power in the BLE    Baseline Evaluation: 15 seconds hands-free    Time 4    Period Weeks    Status New    Target Date 05/30/21               PT Long Term Goals - 05/02/21 1304       PT LONG TERM GOAL #1   Title Patient to demonstrate 5 times STS less than 10 seconds to demonstrate BLE power equal to population norm values.    Baseline Eval: 15sec hands free    Time 8    Period Weeks    Status New    Target Date 06/27/21      PT LONG TERM GOAL #2   Title Patient to demonstrate tolerance of 79M WT greater than 1400 feet with SPC or L RAD to show progression back to baseline mobility    Baseline Evaluation: Unable to tolerate distances outside of the home without RW    Time 8    Period Weeks    Status New    Target Date 06/27/21      PT LONG TERM GOAL #3   Title Patient to demonstrate MMT 5/5 in right hip flexion, hip abduction, hip extension, hip ER, hip IR.    Baseline Evaluation: Unable to test due to pain    Time 8    Period Weeks    Status New    Target Date 06/27/21            HEP includes the following:  Access Code: [email or web address] URL: [email or web address] Date: 05/14/2021 Prepared by: Ellin Goodie  Exercises Standing Hip Abduction with Counter Support - 1 x daily - 3 x weekly - 3 sets - 10 reps Standing March with  Counter Support - 1 x daily - 3 x weekly - 3 sets - 10 reps Standing Knee Flexion with Unilateral Counter Support - 1 x daily - 3 x weekly -  3 sets - 10 reps Mini Squat with Counter Support - 1 x daily - 3 x weekly - 3 sets - 10 reps Supine Hip Adduction Isometric with Ball - 1 x daily - 3 x weekly - 3 sets - 10 reps - 5 hold Seated Long Arc Quad - 1 x daily - 3 x weekly - 3 sets - 10 reps    Patient will benefit from skilled therapeutic intervention in order to improve the following deficits and impairments:  Abnormal gait, Decreased balance, Decreased endurance, Decreased mobility, Difficulty walking, Hypomobility, Increased muscle spasms, Decreased activity tolerance, Decreased knowledge of precautions, Decreased range of motion, Decreased strength  Visit Diagnosis: Stiffness of right hip, not elsewhere classified  Difficulty in walking, not elsewhere classified     Problem List Patient Active Problem List   Diagnosis Date Noted   Acute blood loss anemia    Drop in hemoglobin    Closed fracture of right hip requiring operative repair (HCC) 03/28/2021   Hypokalemia 03/28/2021   Hypomagnesemia 03/28/2021   Essential hypertension 03/28/2021   Hypertriglyceridemia 03/28/2021   GERD (gastroesophageal reflux disease) 03/28/2021   Urethral caruncle 04/19/2016   Ellin Goodie PT, DPT  05/14/2021, 2:28 PM  [facility] [address], [place], [address] Phone: [phone]   Fax:  [phone]  Name: EVORA SCHECHTER MRN: [id number] Date of Birth: 1965/09/27

## 2021-05-16 ENCOUNTER — Ambulatory Visit: Payer: Self-pay | Admitting: Physical Therapy

## 2021-05-16 DIAGNOSIS — M25651 Stiffness of right hip, not elsewhere classified: Secondary | ICD-10-CM

## 2021-05-16 DIAGNOSIS — R262 Difficulty in walking, not elsewhere classified: Secondary | ICD-10-CM

## 2021-05-16 NOTE — Therapy (Signed)
[facility] [address] [place], [address] Phone: [phone]   Fax:  [phone]  Physical Therapy Treatment  Patient Details  Name: Michele [facility] MRN: [id number] Date of Birth: 10-19-1965 Referring Provider (PT): Dedra Skeens, PA-C (Signa Kell, MD (ortho))   Encounter Date: 05/16/2021   PT End of Session - 05/16/21 0900     Visit Number 4    Number of Visits 16    Date for PT Re-Evaluation 06/27/21    Authorization Type Self pay, no insurance: medicaid application in process    Authorization Time Period 05/02/21-06/27/21    PT Start Time 0850    PT Stop Time 0930    PT Time Calculation (min) 40 min    Equipment Utilized During Treatment Gait belt    Activity Tolerance Patient tolerated treatment well;No increased pain    Behavior During Therapy WFL for tasks assessed/performed             Past Medical History:  Diagnosis Date   GERD (gastroesophageal reflux disease)    Hypertension     Past Surgical History:  Procedure Laterality Date   BLADDER SURGERY     Bladder tact   BREAST BIOPSY Left 07/03/2016   path pending   INTRAMEDULLARY (IM) NAIL INTERTROCHANTERIC Right 03/28/2021   Procedure: INTRAMEDULLARY (IM) NAIL INTERTROCHANTRIC;  Surgeon: Signa Kell, MD;  Location: [facility] ORS;  Service: Orthopedics;  Laterality: Right;    There were no vitals filed for this visit.   Subjective Assessment - 05/16/21 0855     Subjective Pt reports she is feeling sore from exercises.    Pertinent History Michele Bernard is a 55yoF who comes to [facility] OPPT evaluation s/p fall onto Right hip, fracture, and IM nailing on 9/1. Pt DC to home, no home services, has been working on HEP given at hospital, progressing time on feet gradually with RW, began integrating SPC use on post op week 4.    Currently in Pain? Yes    Pain Score 4     Pain Location Hip    Pain Orientation Right    Pain Descriptors / Indicators  Aching    Pain Type Acute pain             THEREX:  Supine AAROM Ball Rolls Flexion/Extension 1 x 30  Supine AAROM Ball Rolls Lower Trunk Rotation 1 x 30  Supine Isometric Hip Adduction Ball Squeeze 1 x 10 x 5 sec   Supine Bridges 3 x 10  Standing Marches with BUE support  3 x 10   Provided patient with information on [facility] in case she does not receive [employer] and tennis balls for two wheeled walker.    Updated HEP and educated patient on changes to exercises and addition of new exercises         PT Education - 05/16/21 0857     Education Details form/technique for appropriate exercise    Person(s) Educated Patient    Methods Explanation    Comprehension Verbalized understanding;Returned demonstration;Verbal cues required              PT Short Term Goals - 05/16/21 0907       PT SHORT TERM GOAL #1   Title After 4 weeks pt will improve FOTO score by 10 points indicative of improved perceived ease in performing basic mobility for ADL and IADL.    Baseline Evaluation: 34    Time 4    Period  Weeks    Status New    Target Date 05/30/21      PT SHORT TERM GOAL #2   Title In 4 weeks patient to demonstrate improved 5 times STS hands-free less than 12 seconds to demonstrate improved power in the BLE    Baseline Evaluation: 15 seconds hands-free    Time 4    Period Weeks    Status New    Target Date 05/30/21               PT Long Term Goals - 05/16/21 0929       PT LONG TERM GOAL #1   Title Patient to demonstrate 5 times STS less than 10 seconds to demonstrate BLE power equal to population norm values.    Baseline Eval: 15sec hands free    Time 8    Period Weeks    Status New      PT LONG TERM GOAL #2   Title Patient to demonstrate tolerance of 74M WT greater than 1400 feet with SPC or L RAD to show progression back to baseline mobility    Baseline Evaluation: Unable to tolerate distances outside of the home without RW    Time 8    Period  Weeks    Status New      PT LONG TERM GOAL #3   Title Patient to demonstrate MMT 5/5 in right hip flexion, hip abduction, hip extension, hip ER, hip IR.    Baseline Evaluation: Unable to test due to pain    Time 8    Period Weeks    Status New                   Plan - 05/16/21 0901     Clinical Impression Statement Pt presents with increased in pain and soreness in right hip s/p 6 weeks right hip fracture. Exercies were modified to include less standing exercises to avoid further aggravating right hip pain. She was able to tolerate all exercises without an increase in her pain. PT recommends that pt eat at least 3 meals per day and continue to maintain adequate sleep. 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 PT provided pt with contact information for [facility] for pro bono service in case she does not have bills covered by Medicaid.    Examination-Activity Limitations Bend    Examination-Participation Restrictions Cleaning;Meal Prep;Community Activity;Yard Work;Shop    Stability/Clinical Decision Making Stable/Uncomplicated    Rehab Potential Good    PT Frequency 2x / week    PT Duration 8 weeks    PT Treatment/Interventions ADLs/Self Care Home Management;Cryotherapy;Electrical Stimulation;Moist Heat;Gait training;Stair training;Functional mobility training;Therapeutic activities;Therapeutic exercise;Balance training;Patient/family education;Manual techniques;Passive range of motion;Dry needling    PT Next Visit Plan Progress to dyamic hip strengthening activities: Monster bands. Single leg stance    PT Home Exercise Plan [email or web address]    Consulted and Agree with Plan of Care Patient            HEP includes following:  Access Code: [email or web address] URL: [email or web address] Date: 05/16/2021 Prepared by: Ellin Goodie  Exercises Standing Hip Abduction with Counter Support - 1 x daily - 3 x weekly - 3 sets - 10 reps Standing March with Counter Support - 1 x daily - 3 x weekly - 3 sets - 10 reps Standing Knee Flexion with Unilateral Counter Support -  1 x daily - 3 x weekly - 3 sets - 10 reps Mini Squat with Counter Support - 1 x daily - 3 x weekly - 3 sets - 10 reps Supine Hip Adduction Isometric with Ball - 1 x daily - 3 x weekly - 3 sets - 10 reps - 5 hold Seated Long Arc Quad - 1 x daily - 3 x weekly - 3 sets - 10 reps Supine Bridge - 1 x daily - 3 x weekly - 3 sets - 10 reps   Patient will benefit from skilled therapeutic intervention in order to improve the following deficits and impairments:  Abnormal gait, Decreased balance, Decreased endurance, Decreased mobility, Difficulty walking, Hypomobility, Increased muscle spasms, Decreased activity tolerance, Decreased knowledge of precautions, Decreased range of motion, Decreased strength  Visit Diagnosis: Stiffness of right hip, not elsewhere classified  Difficulty in walking, not elsewhere classified     Problem List Patient Active Problem List   Diagnosis Date Noted   Acute blood loss anemia    Drop in hemoglobin    Closed fracture of right hip requiring operative repair (HCC) 03/28/2021   Hypokalemia 03/28/2021   Hypomagnesemia 03/28/2021   Essential hypertension 03/28/2021   Hypertriglyceridemia 03/28/2021   GERD (gastroesophageal reflux disease) 03/28/2021   Urethral caruncle 04/19/2016   Ellin Goodie PT, DPT  05/16/2021, 2:30 PM  [place] [facility] [address] [place], [address] Phone: [phone]   Fax:  [phone]  Name: Michele [facility] MRN: [id number] Date of Birth: Jun 19, 1966

## 2021-05-21 ENCOUNTER — Ambulatory Visit: Payer: Self-pay | Admitting: Physical Therapy

## 2021-05-23 ENCOUNTER — Ambulatory Visit: Payer: Self-pay | Admitting: Physical Therapy

## 2021-05-23 ENCOUNTER — Encounter: Payer: Self-pay | Admitting: Physical Therapy

## 2021-05-23 DIAGNOSIS — R262 Difficulty in walking, not elsewhere classified: Secondary | ICD-10-CM

## 2021-05-23 DIAGNOSIS — M25651 Stiffness of right hip, not elsewhere classified: Secondary | ICD-10-CM

## 2021-05-23 NOTE — Therapy (Signed)
[facility] [address], [place], [address] Phone: [phone]   Fax:  [phone]  Physical Therapy Treatment  Patient Details  Name: Michele Bernard MRN: [id number] Date of Birth: 04/23/1966 Referring Provider (PT): Dedra Skeens, PA-C (Signa Kell, MD (ortho))   Encounter Date: 05/23/2021   PT End of Session - 05/23/21 1549     Visit Number 5    Number of Visits 16    Date for PT Re-Evaluation 06/27/21    Authorization Type Self pay, no insurance: medicaid application in process    Authorization Time Period 05/02/21-06/27/21    PT Start Time 1545    PT Stop Time 1630    PT Time Calculation (min) 45 min    Equipment Utilized During Treatment Gait belt    Activity Tolerance Patient tolerated treatment well;No increased pain    Behavior During Therapy WFL for tasks assessed/performed             Past Medical History:  Diagnosis Date   GERD (gastroesophageal reflux disease)    Hypertension     Past Surgical History:  Procedure Laterality Date   BLADDER SURGERY     Bladder tact   BREAST BIOPSY Left 07/03/2016   path pending   INTRAMEDULLARY (IM) NAIL INTERTROCHANTERIC Right 03/28/2021   Procedure: INTRAMEDULLARY (IM) NAIL INTERTROCHANTRIC;  Surgeon: Signa Kell, MD;  Location: [facility] ORS;  Service: Orthopedics;  Laterality: Right;    There were no vitals filed for this visit.   Subjective Assessment - 05/23/21 1545     Subjective Pt reports that hip pain is tolerable and that she mostly just feels it along side of hip.    Pertinent History Aislee Landgren is a 55yoF who comes to [facility] OPPT evaluation s/p fall onto Right hip, fracture, and IM nailing on 9/1. Pt DC to home, no home services, has been working on HEP given at hospital, progressing time on feet gradually with RW, began integrating SPC use on post op week 4.    How long can you sit comfortably? Pt reports leg falls asleep after 10-15  minutes sitting; (also posterior to lateral hip halfway to knee numb)    How long can you stand comfortably? 5 - 10 minutes with RW    How long can you walk comfortably? limited community distance AMB c RW    Currently in Pain? Yes    Pain Score 3     Pain Location Hip    Pain Orientation Right    Pain Descriptors / Indicators Aching    Pain Type Acute pain    Pain Onset More than a month ago    Pain Frequency Intermittent             THEREX:   Wall Squats with Ball 2 x 10 with foot ER for VMO activation  Seated Isometric Adduction Ball Squeeze 1 x 10 with 5 sec hold  10 M X 2  walk without SPC  10 M X 2 walk with 4 lb DB  10 M X 2  walk with #4 AW  Standing Hip Abduction with 1 UE 1 x 10 with LLE as stance leg  Standing Hip Abduction with 2 UE 2 x 10 with RLE as stance leg  Step Ups onto a 1 ft step 2 x 10    Updated HEP and educated patient on changes to exercises and addition of new exercise that include step ups and single leg stance.  Pt encouraged to ambulate in house without [facility] and to ambulate outside of house with a SPC.               PT Education - 05/23/21 1549     Education Details form/technique for appropriate exercise    Person(s) Educated Patient    Methods Explanation;Demonstration;Verbal cues;Handout    Comprehension Verbalized understanding;Returned demonstration;Verbal cues required              PT Short Term Goals - 05/23/21 1550       PT SHORT TERM GOAL #1   Title After 4 weeks pt will improve FOTO score by 10 points indicative of improved perceived ease in performing basic mobility for ADL and IADL.    Baseline Evaluation: 34    Time 4    Period Weeks    Status New    Target Date 05/30/21      PT SHORT TERM GOAL #2   Title In 4 weeks patient to demonstrate improved 5 times STS hands-free less than 12 seconds to demonstrate improved power in the BLE    Baseline Evaluation: 15 seconds hands-free    Time 4    Period Weeks     Status New    Target Date 05/30/21               PT Long Term Goals - 05/23/21 1551       PT LONG TERM GOAL #1   Title Patient to demonstrate 5 times STS less than 10 seconds to demonstrate BLE power equal to population norm values.    Baseline Eval: 15sec hands free    Time 8    Period Weeks    Status New      PT LONG TERM GOAL #2   Title Patient to demonstrate tolerance of 16M WT greater than 1400 feet with SPC or L RAD to show progression back to baseline mobility    Baseline Evaluation: Unable to tolerate distances outside of the home without RW    Time 8    Period Weeks    Status New      PT LONG TERM GOAL #3   Title Patient to demonstrate MMT 5/5 in right hip flexion, hip abduction, hip extension, hip ER, hip IR.    Baseline Evaluation: Unable to test due to pain    Time 8    Period Weeks    Status New                   Plan - 05/23/21 1550     Clinical Impression Statement Pt presents for f/u for right hip fracture s/p 7 weeks. She exhibits improvement in hip strength with ability to complete step ups, ambulate without an AD, and perform squats without upper extremity support.  She was able to tolerate all exercises throughout session without an increase in his pain. PT recommends that attempt standing for prolonged periods of time that are same duration as job to begin conditioning process for return to work and to begin to ambulate without an AD. She is in agreement with this plan and the frequency of her plan of care has been decreased because of her high level of compliance and motivation.    Examination-Activity Limitations [place]    Examination-Participation Restrictions Cleaning;Meal Prep;Community Activity;Yard Work;Shop    Stability/Clinical Decision Making Stable/Uncomplicated    Clinical Decision Making Low    Rehab Potential Good    PT Frequency 2x / week  PT Duration 8 weeks    PT Treatment/Interventions ADLs/Self Care Home  Management;Cryotherapy;Electrical Stimulation;Moist Heat;Gait training;Stair training;Functional mobility training;Therapeutic activities;Therapeutic exercise;Balance training;Patient/family education;Manual techniques;Passive range of motion;Dry needling    PT Next Visit Plan Progress hip strengthening exercises: Lunges and squats with UE support    PT Home Exercise Plan [email or web address]    Consulted and Agree with Plan of Care Patient            HEP includes the following:  Access Code: [email or web address] URL: [email or web address] Date: 05/23/2021 Prepared by: Ellin Goodie  Exercises Standing Hip Abduction with Counter Support - 1 x daily - 3 x weekly - 3 sets - 10 reps Mini Squat with Counter Support - 1 x daily - 3 x weekly - 3 sets - 10 reps Step Up - 1 x daily - 3 x weekly - 2 sets - 10 reps Seated Hip Adduction Isometrics with Ball - 1 x daily - 3 x weekly - 2 sets - 10 reps - 5 hold Single Leg Stance - 1 x daily - 3 x weekly - 1 sets - 5 reps - 30 hold   Patient will benefit from skilled therapeutic intervention in order to improve the following deficits and impairments:  Abnormal gait, Decreased balance, Decreased endurance, Decreased mobility, Difficulty walking, Hypomobility, Increased muscle spasms, Decreased activity tolerance, Decreased knowledge of precautions, Decreased range of motion, Decreased strength  Visit Diagnosis: Stiffness of right hip, not elsewhere classified  Difficulty in walking, not elsewhere classified     Problem List Patient Active Problem List   Diagnosis Date Noted   Acute blood loss anemia    Drop in hemoglobin    Closed fracture of right hip requiring operative repair (HCC) 03/28/2021   Hypokalemia 03/28/2021   Hypomagnesemia 03/28/2021   Essential hypertension 03/28/2021   Hypertriglyceridemia 03/28/2021   GERD (gastroesophageal reflux disease) 03/28/2021   Urethral caruncle 04/19/2016   Ellin Goodie PT, DPT  05/23/2021, 4:57  PM  [facility] [address], [place], [address] Phone: [phone]   Fax:  [phone]  Name: NATHAN STALLWORTH MRN: [id number] Date of Birth: June 12, 1966

## 2021-05-27 ENCOUNTER — Ambulatory Visit: Payer: Self-pay | Admitting: Physical Therapy

## 2021-05-27 DIAGNOSIS — M25651 Stiffness of right hip, not elsewhere classified: Secondary | ICD-10-CM

## 2021-05-27 DIAGNOSIS — R262 Difficulty in walking, not elsewhere classified: Secondary | ICD-10-CM

## 2021-05-27 NOTE — Therapy (Signed)
[place] [facility] [address] [place], [address] Phone: [phone]   Fax:  [phone]  Physical Therapy Treatment  Patient Details  Name: Michele Bernard MRN: [id number] Date of Birth: 1966/01/10 Referring Provider (PT): Reche Dixon, PA-C (Leim Fabry, MD (ortho))   Encounter Date: 05/27/2021   PT End of Session - 05/27/21 1000     Visit Number 6    Number of Visits 16    Date for PT Re-Evaluation 06/27/21    Authorization Type Self pay, no insurance: medicaid application in process    Authorization Time Period 05/02/21-06/27/21    Progress Note Due on Visit 10    PT Start Time 0930    PT Stop Time 1015    PT Time Calculation (min) 45 min    Equipment Utilized During Treatment Gait belt    Activity Tolerance Patient tolerated treatment well;No increased pain    Behavior During Therapy WFL for tasks assessed/performed             Past Medical History:  Diagnosis Date   GERD (gastroesophageal reflux disease)    Hypertension     Past Surgical History:  Procedure Laterality Date   BLADDER SURGERY     Bladder tact   BREAST BIOPSY Left 07/03/2016   path pending   INTRAMEDULLARY (IM) NAIL INTERTROCHANTERIC Right 03/28/2021   Procedure: INTRAMEDULLARY (IM) NAIL INTERTROCHANTRIC;  Surgeon: Leim Fabry, MD;  Location: [facility] ORS;  Service: Orthopedics;  Laterality: Right;    There were no vitals filed for this visit.   Subjective Assessment - 05/27/21 0932     Subjective Pt reports that she has started to resume work and that she feels some pain in the side of her hip but otherwise she feels ok.    Pertinent History Michele Bernard is a 44yoF who comes to [facility] OPPT evaluation s/p fall onto Right hip, fracture, and IM nailing on 9/1. Pt DC to home, no home services, has been working on HEP given at hospital, progressing time on feet gradually with RW, began integrating SPC use on post op week 4.    How long  can you sit comfortably? Pt reports leg falls asleep after 10-15 minutes sitting; (also posterior to lateral hip halfway to knee numb)    How long can you stand comfortably? 5 - 10 minutes with RW    How long can you walk comfortably? limited community distance AMB c RW    Currently in Pain? Yes    Pain Score 4     Pain Location Hip    Pain Orientation Right    Pain Descriptors / Indicators Aching    Pain Type Acute pain    Pain Onset More than a month ago             THEREX:  Nu-Step Level 6 with Arm Length and Level 2 with resistance 10 min   Seated Hip Adduction Isometric Ball Holds 2 x 10 x 5 sec   Supine Straight Leg Raise 3 x 10 with #4 AW on LLE and #3 AW on RLE   Supine Hip Adductor Stretch   Supine Bridges 3 x 10  Supine Adductor Stretch 2 x 30 sec    Updated HEP and educated patient on changes to exercises and addition of new exercise to include adductor stretch.          PT Education - 05/27/21 0959     Education Details form/techinique for appropriate  exercise    Person(s) Educated Patient    Methods Explanation;Demonstration;Verbal cues;Handout    Comprehension Verbalized understanding;Returned demonstration;Verbal cues required              PT Short Term Goals - 05/27/21 1025       PT SHORT TERM GOAL #1   Title After 4 weeks pt will improve FOTO score by 10 points indicative of improved perceived ease in performing basic mobility for ADL and IADL.    Baseline Evaluation: 34    Time 4    Period Weeks    Status On-going    Target Date 05/30/21      PT SHORT TERM GOAL #2   Title In 4 weeks patient to demonstrate improved 5 times STS hands-free less than 12 seconds to demonstrate improved power in the BLE    Baseline Evaluation: 15 seconds hands-free    Time 4    Period Weeks    Status On-going    Target Date 05/30/21               PT Long Term Goals - 05/27/21 1025       PT LONG TERM GOAL #1   Title Patient to demonstrate 5  times STS less than 10 seconds to demonstrate BLE power equal to population norm values.    Baseline Eval: 15sec hands free    Time 8    Period Weeks    Status On-going      PT LONG TERM GOAL #2   Title Patient to demonstrate tolerance of 53M WT greater than 1400 feet with SPC or L RAD to show progression back to baseline mobility    Baseline Evaluation: Unable to tolerate distances outside of the home without RW    Time 8    Period Weeks    Status On-going      PT LONG TERM GOAL #3   Title Patient to demonstrate MMT 5/5 in right hip flexion, hip abduction, hip extension, hip ER, hip IR.    Baseline Evaluation: Unable to test due to pain    Time 8    Period Weeks    Status On-going             HEP includes the following:  Access Code: [email or web address] URL: [email or web address] Date: 05/27/2021 Prepared by: Ellin Goodie  Exercises Supine Hip Adductor Stretch - 1 x daily - 7 x weekly - 1 sets - 4 reps - 30 hold Standing Hip Abduction with Counter Support - 1 x daily - 3 x weekly - 3 sets - 10 reps Mini Squat with Counter Support - 1 x daily - 3 x weekly - 3 sets - 10 reps Step Up - 1 x daily - 3 x weekly - 2 sets - 10 reps Seated Hip Adduction Isometrics with Ball - 1 x daily - 3 x weekly - 2 sets - 10 reps - 5 hold Single Leg Stance - 1 x daily - 3 x weekly - 1 sets - 5 reps - 30 hold       Plan - 05/27/21 1003     Clinical Impression Statement Pt presents for f/u for right hip fracture s/p 8 weeks. 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 She was able to tolerate all exercises without an increase in her pain with exception of straight leg raise where weight had to be  decreased for pt to be better able to tolerate. 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    Examination-Activity Limitations [place]    Examination-Participation Restrictions Cleaning;Meal Prep;Community Activity;Yard Work;Shop    Stability/Clinical Decision Making Stable/Uncomplicated    Rehab Potential Good    PT Frequency 2x / week    PT Duration 8 weeks    PT Treatment/Interventions ADLs/Self Care Home Management;Cryotherapy;Electrical Stimulation;Moist Heat;Gait training;Stair training;Functional mobility training;Therapeutic activities;Therapeutic exercise;Balance training;Patient/family education;Manual techniques;Passive range of motion;Dry needling    PT Next Visit Plan Progress hip strengthening exercises: Lunges and squats with UE support    PT Home Exercise Plan [email or web address]    Consulted and Agree with Plan of Care Patient             Patient will benefit from skilled therapeutic intervention in order to improve the following deficits and impairments:  Abnormal gait, Decreased balance, Decreased endurance, Decreased mobility, Difficulty walking, Hypomobility, Increased muscle spasms, Decreased activity tolerance, Decreased knowledge of precautions, Decreased range of motion, Decreased strength  Visit Diagnosis: Stiffness of right hip, not elsewhere classified  Difficulty in walking, not elsewhere classified     Problem List Patient Active Problem List   Diagnosis Date Noted   Acute blood loss anemia    Drop in hemoglobin    Closed fracture of right hip requiring operative repair ([place]) 03/28/2021   Hypokalemia 03/28/2021   Hypomagnesemia 03/28/2021   Essential hypertension 03/28/2021   Hypertriglyceridemia 03/28/2021   GERD (gastroesophageal reflux disease) 03/28/2021   Urethral caruncle 04/19/2016   Bradly Chris PT, DPT  05/27/2021, 10:30 AM  [place]  PHYS[facility] AND SPORTS MEDICINE [address] [place], [address] Phone: [phone]   Fax:  [phone]  Name: CHRISTIEN SCHROTH MRN: [id number] Date of Birth: March 03, 1966

## 2021-05-30 ENCOUNTER — Encounter: Payer: Self-pay | Admitting: Physical Therapy

## 2021-06-04 ENCOUNTER — Ambulatory Visit: Payer: Self-pay | Attending: Orthopedic Surgery | Admitting: Physical Therapy

## 2021-06-04 ENCOUNTER — Telehealth: Payer: Self-pay | Admitting: Physical Therapy

## 2021-06-04 DIAGNOSIS — R262 Difficulty in walking, not elsewhere classified: Secondary | ICD-10-CM | POA: Insufficient documentation

## 2021-06-04 DIAGNOSIS — M25651 Stiffness of right hip, not elsewhere classified: Secondary | ICD-10-CM | POA: Insufficient documentation

## 2021-06-04 NOTE — Telephone Encounter (Signed)
Called pt to inquiry about absence. Did not reach so left VM and instructed pt to call back.

## 2021-06-06 ENCOUNTER — Ambulatory Visit: Payer: Self-pay | Admitting: Physical Therapy

## 2021-06-06 ENCOUNTER — Encounter: Payer: Self-pay | Admitting: Physical Therapy

## 2021-06-06 DIAGNOSIS — R262 Difficulty in walking, not elsewhere classified: Secondary | ICD-10-CM

## 2021-06-06 DIAGNOSIS — M25651 Stiffness of right hip, not elsewhere classified: Secondary | ICD-10-CM

## 2021-06-06 NOTE — Therapy (Signed)
[place] [facility] [address] [place], [address] Phone: [phone]   Fax:  [phone]  Physical Therapy Treatment  Patient Details  Name: Michele Bernard MRN: [id number] Date of Birth: [birth year] Referring Provider (PT): Reche Dixon, PA-C (Leim Fabry, MD (ortho))   Encounter Date: 06/06/2021   PT End of Session - 06/06/21 1531     Visit Number 7    Number of Visits 16    Date for PT Re-Evaluation 06/27/21    Authorization Type Self pay, no insurance: medicaid application in process    Authorization Time Period 05/02/21-06/27/21    Progress Note Due on Visit 10    PT Start Time 0930    PT Stop Time 1015    PT Time Calculation (min) 45 min    Equipment Utilized During Treatment Gait belt    Activity Tolerance Patient tolerated treatment well;No increased pain    Behavior During Therapy WFL for tasks assessed/performed             Past Medical History:  Diagnosis Date   GERD (gastroesophageal reflux disease)    Hypertension     Past Surgical History:  Procedure Laterality Date   BLADDER SURGERY     Bladder tact   BREAST BIOPSY Left 07/03/2016   path pending   INTRAMEDULLARY (IM) NAIL INTERTROCHANTERIC Right 03/28/2021   Procedure: INTRAMEDULLARY (IM) NAIL INTERTROCHANTRIC;  Surgeon: Leim Fabry, MD;  Location: [facility] ORS;  Service: Orthopedics;  Laterality: Right;    There were no vitals filed for this visit.   Subjective Assessment - 06/06/21 0932     Subjective Pt reports that she has returned to work for 3 to 4 hours per day. She feels soreness in hip, but she has not been lifting any large objects. She anticipates returning to full time after the 1st of the year.    Pertinent History Michele Bernard is a 5yoF who comes to [facility] OPPT evaluation s/p fall onto Right hip, fracture, and IM nailing on 9/1. Pt DC to home, no home services, has been working on HEP given at hospital, progressing time on  feet gradually with RW, began integrating SPC use on post op week 4.    How long can you sit comfortably? Pt reports leg falls asleep after 10-15 minutes sitting; (also posterior to lateral hip halfway to knee numb)    How long can you stand comfortably? 5 - 10 minutes with RW    How long can you walk comfortably? limited community distance AMB c RW    Currently in Pain? Yes    Pain Score 2     Pain Location Hip    Pain Orientation Right    Pain Descriptors / Indicators Aching    Pain Type Acute pain    Pain Onset More than a month ago             THEREX:   5 x Sit to Stand: 12 sec   STRENGTH:  Hip Flexion R/L: 4+/4+  Hip Abduction R/L: 4/4  Hip Ext R/L: 4/4    6 minute walk test: 1,208 ft outside on pavement up and down hills  1,000 ft is community level ambulator   Review of HEP with increase in repetitions to focus more on endurance. Educated pt on need to prioritize sleep, nutrition, and then exercise to optimize recovery.       PT Education - 06/06/21 1530     Education Details form/technique for  appropriate exercise    Person(s) Educated Patient    Methods Explanation;Demonstration;Verbal cues;Handout    Comprehension Verbalized understanding;Returned demonstration;Verbal cues required              PT Short Term Goals - 06/06/21 0942       PT SHORT TERM GOAL #1   Title After 4 weeks pt will improve FOTO score by 10 points indicative of improved perceived ease in performing basic mobility for ADL and IADL.    Baseline Evaluation: 34    Time 4    Period Weeks    Status On-going    Target Date 05/30/21      PT SHORT TERM GOAL #2   Title In 4 weeks patient to demonstrate improved 5 times STS hands-free less than 12 seconds to demonstrate improved power in the BLE    Baseline Evaluation: 15 seconds hands-free    Time 4    Period Weeks    Status On-going    Target Date 05/30/21               PT Long Term Goals - 06/06/21 0943       PT LONG  TERM GOAL #1   Title Patient to demonstrate 5 times STS less than 10 seconds to demonstrate BLE power equal to population norm values.    Baseline Eval: 15sec hands free 11/10: 12 sec    Time 8    Period Weeks    Status Partially Met      PT LONG TERM GOAL #2   Title Patient to demonstrate tolerance of 56M WT greater than 1400 feet with SPC or L RAD to show progression back to baseline mobility    Baseline Evaluation: Unable to tolerate distances outside of the home without RW 11/10: 1,208    Time 8    Period Weeks    Status Partially Met      PT LONG TERM GOAL #3   Title Patient to demonstrate MMT 5/5 in right hip flexion, hip abduction, hip extension, hip ER, hip IR.    Baseline Evaluation: Unable to test due to pain  11/2: Hip Flex R/L 4+/4+, Hip IR R/L  4/4, Hip Ext R/L 4+, 4+, Hip Abd R/L 4+/4+    Time 8    Period Weeks    Status On-going                   Plan - 06/06/21 1532     Clinical Impression Statement Pt presents for re-evaluation for right hip fracture s/p 11 weeks femur repair. Pt has returned to working part time and she has been able tolerate all working responsibilities albeit with inceased LE fatigue. Pt has nearly met all her goals, and she has nearly reached her prior level of function and she is capable of following HEP to continue to progress to point of returning to work independently. PT recommends that pt prioritize rest, nutrition and following HEP to make a full recovery, and that if further issues were to occur to have physician send another referral for more PT.    Examination-Activity Limitations [place]    Examination-Participation Restrictions Cleaning;Meal Prep;Community Activity;Yard Work;Shop    Stability/Clinical Decision Making Stable/Uncomplicated    Clinical Decision Making Low    Rehab Potential Good    PT Frequency 2x / week    PT Duration 8 weeks    PT Treatment/Interventions ADLs/Self Care Home Management;Cryotherapy;Electrical  Stimulation;Moist Heat;Gait training;Stair training;Functional mobility training;Therapeutic activities;Therapeutic exercise;Balance training;Patient/family education;Manual techniques;Passive  range of motion;Dry needling    PT Next Visit Plan Progress hip strengthening exercises: Lunges and squats with UE support    PT Home Exercise Plan [email or web address]    Consulted and Agree with Plan of Care Patient            HEP includes the following:  Access Code: [email or web address] URL: [email or web address] Date: 06/06/2021 Prepared by: Bradly Chris  Exercises Supine Hip Adductor Stretch - 1 x daily - 7 x weekly - 1 sets - 4 reps - 30 hold Standing Hip Abduction with Counter Support - 1 x daily - 3 x weekly - 3 sets - 10 reps Mini Squat with Counter Support - 1 x daily - 3 x weekly - 3 sets - 15 reps Step Up - 1 x daily - 3 x weekly - 3 sets - 15 reps Seated Hip Adduction Isometrics with Ball - 1 x daily - 3 x weekly - 3 sets - 10 reps - 5 hold Single Leg Stance - 1 x daily - 3 x weekly - 1 sets - 5 reps - 30 hold  Patient will benefit from skilled therapeutic intervention in order to improve the following deficits and impairments:  Abnormal gait, Decreased balance, Decreased endurance, Decreased mobility, Difficulty walking, Hypomobility, Increased muscle spasms, Decreased activity tolerance, Decreased knowledge of precautions, Decreased range of motion, Decreased strength  Visit Diagnosis: Stiffness of right hip, not elsewhere classified  Difficulty in walking, not elsewhere classified     Problem List Patient Active Problem List   Diagnosis Date Noted   Acute blood loss anemia    Drop in hemoglobin    Closed fracture of right hip requiring operative repair ([place]) 03/28/2021   Hypokalemia 03/28/2021   Hypomagnesemia 03/28/2021   Essential hypertension 03/28/2021   Hypertriglyceridemia 03/28/2021   GERD (gastroesophageal reflux disease) 03/28/2021   Urethral caruncle 04/19/2016    Bradly Chris PT, DPT  06/06/2021, 4:42 PM  [place] [facility] [address], [place], [address] Phone: [phone]   Fax:  [phone]  Name: KEELYNN FURGERSON MRN: [id number] Date of Birth: 1965-12-25

## 2021-06-10 ENCOUNTER — Ambulatory Visit: Payer: Self-pay | Admitting: Physical Therapy

## 2021-06-13 ENCOUNTER — Encounter: Payer: Self-pay | Admitting: Physical Therapy

## 2021-06-18 ENCOUNTER — Encounter: Payer: Self-pay | Admitting: Physical Therapy

## 2021-06-24 ENCOUNTER — Encounter: Payer: Self-pay | Admitting: Physical Therapy

## 2021-06-26 ENCOUNTER — Encounter: Payer: Self-pay | Admitting: Physical Therapy

## 2021-08-30 ENCOUNTER — Telehealth: Payer: Self-pay | Admitting: Pharmacist

## 2021-08-30 NOTE — Telephone Encounter (Signed)
Patient failed to provide requested 2023 financial documentation. No additional medication assistance will be provided by [facility] without the required proof of income documentation. Patient notified by letter. ? ?Michele Bernard ?Medication Management ?

## 2021-09-04 ENCOUNTER — Other Ambulatory Visit: Payer: Self-pay

## 2021-09-11 ENCOUNTER — Telehealth: Payer: Self-pay | Admitting: Pharmacy Technician

## 2021-09-11 NOTE — Telephone Encounter (Signed)
Patient failed to provide 2023 proof of income.  No additional medication assistance will be provided by [facility] without the required proof of income documentation.  Patient notified by letter.  Sherilyn Dacosta Care Manager [facility]    [address] [place], [place]  [address]  August 30, 2021    Dear Kriste Basque:  This is to inform you that you are no longer eligible to receive medication assistance at [facility].  The reason(s) are:    _____Your total gross monthly household income exceeds 300% of the Federal Poverty Level.   _____Tangible assets (savings, checking, stocks/bonds, pension, retirement, etc.) exceeds our limit  _____You are eligible to receive benefits from [facility], [facility] or HIV Medication              Assistance Program _____You are eligible to receive benefits from a Medicare Part D plan _____You have prescription insurance  _____You are not an [facility] resident __X__Failure to provide all requested documentation (proof of income for 2023, and/or Patient Intake Application, DOH Attestation, Contract, etc).    Medication assistance will resume once all requested documentation has been returned to our clinic.  If you have questions, please contact our clinic at [phone].    Thank you,  [facility]

## 2021-10-10 IMAGING — CR DG HIP (WITH OR WITHOUT PELVIS) 2-3V*R*
3 series · 3 of 3 positions shown · non-contrast
Comparison: None.

CLINICAL DATA: Status post fall.

EXAM:
DG HIP (WITH OR WITHOUT PELVIS) 2-3V RIGHT

[pelvis ap]
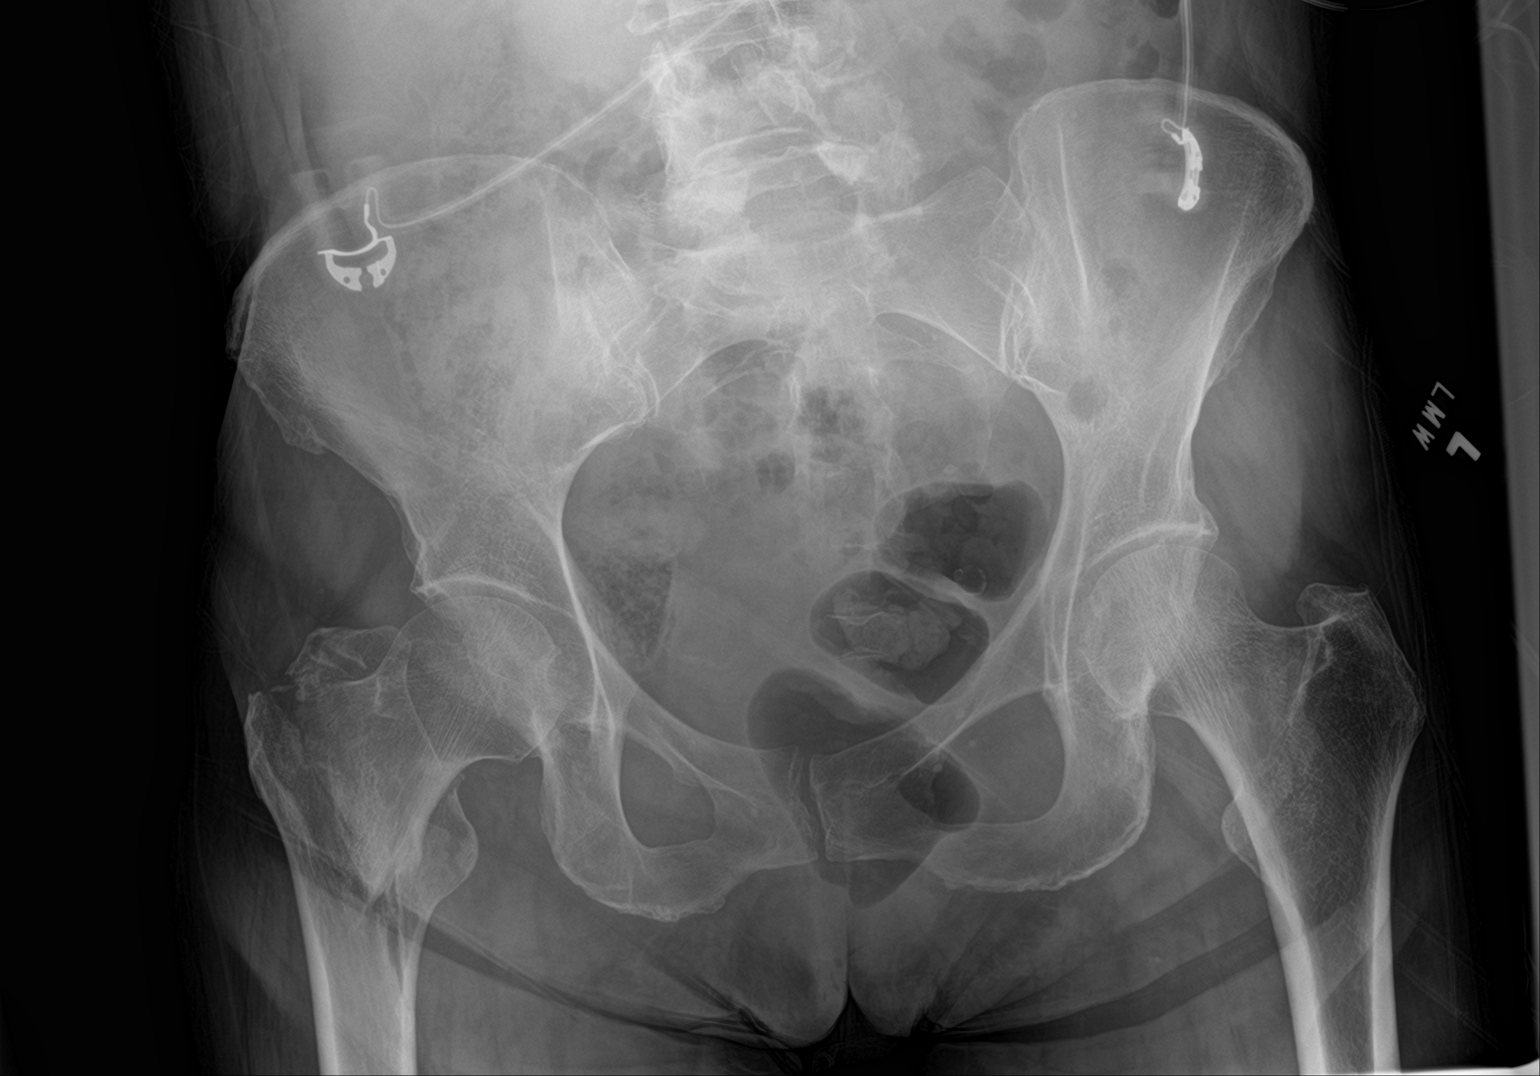

[hip ap]
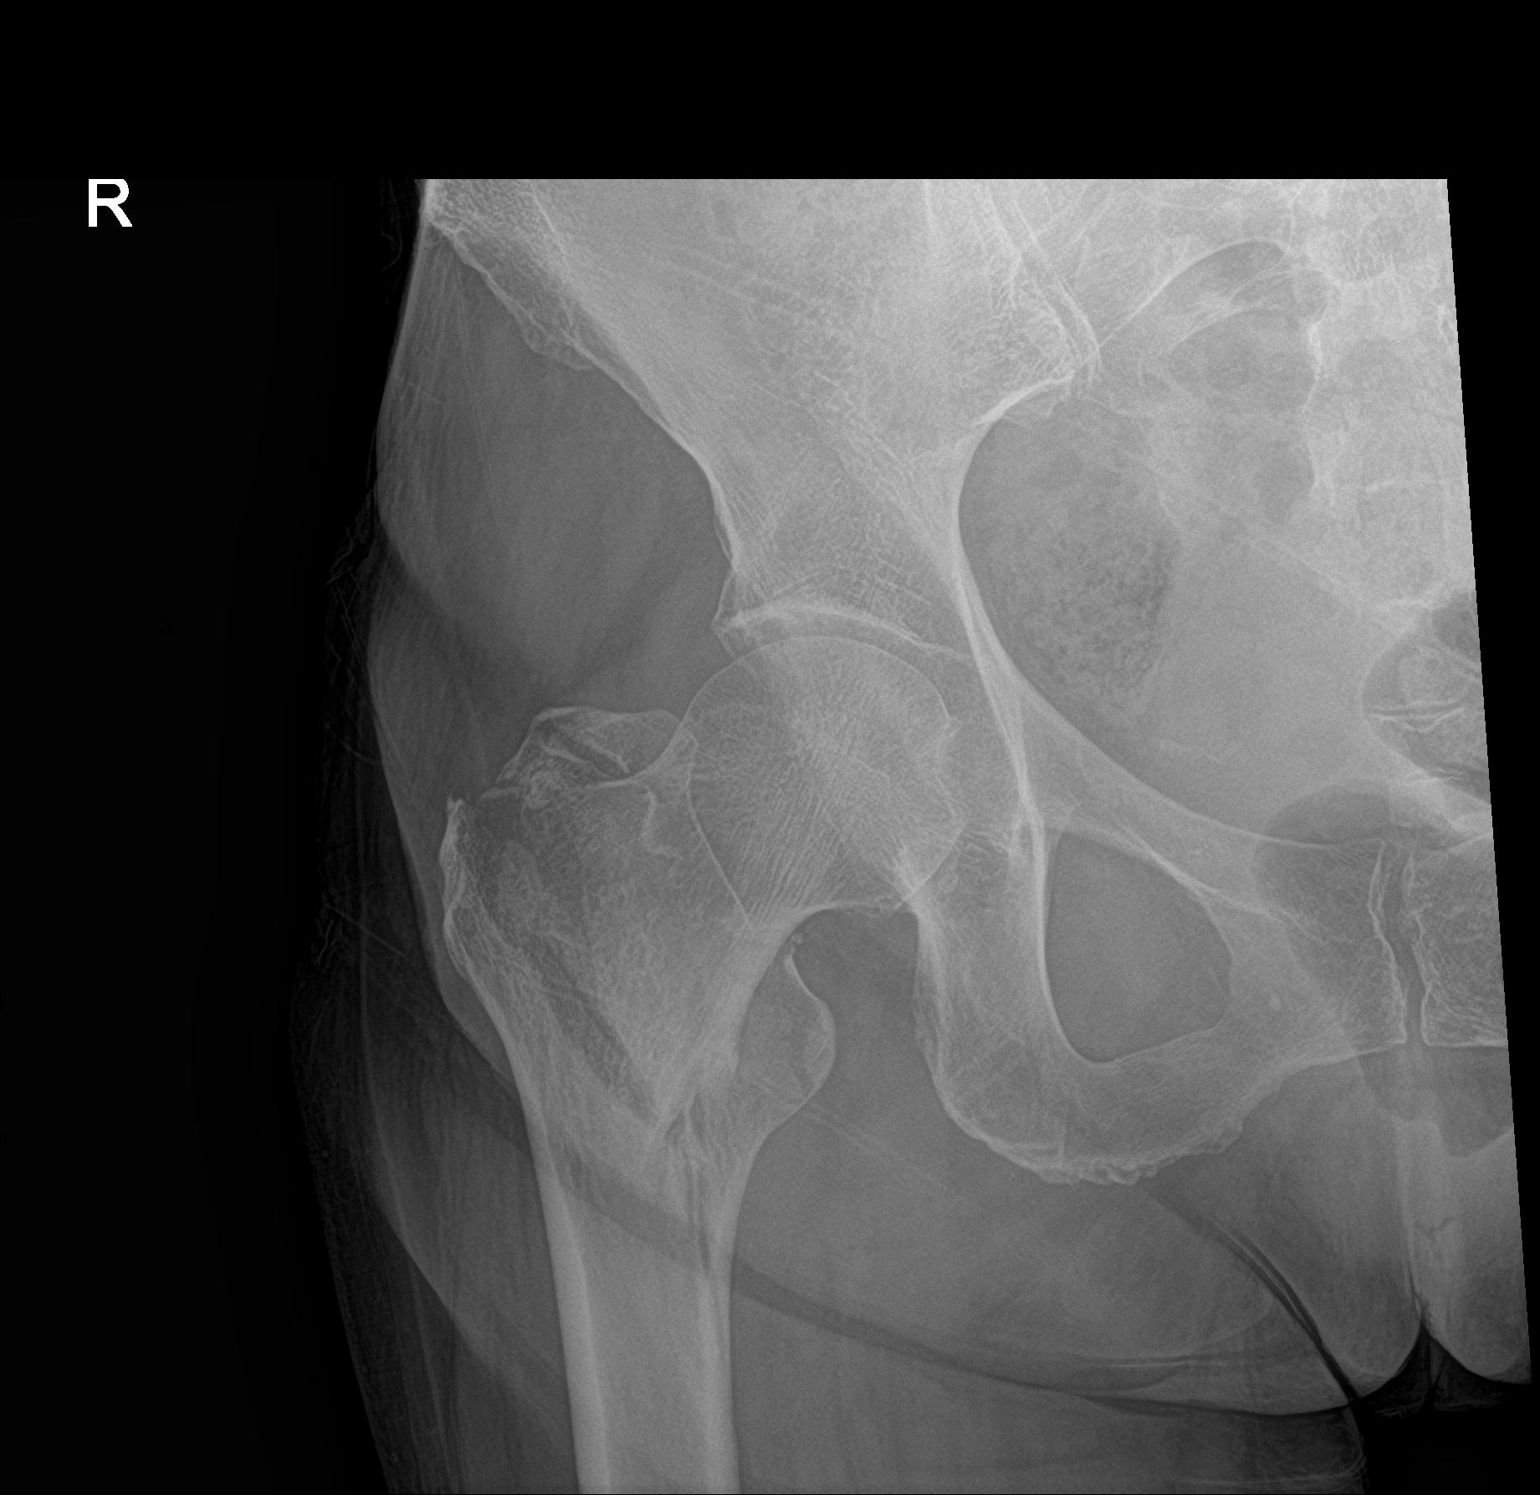

[hip lat]
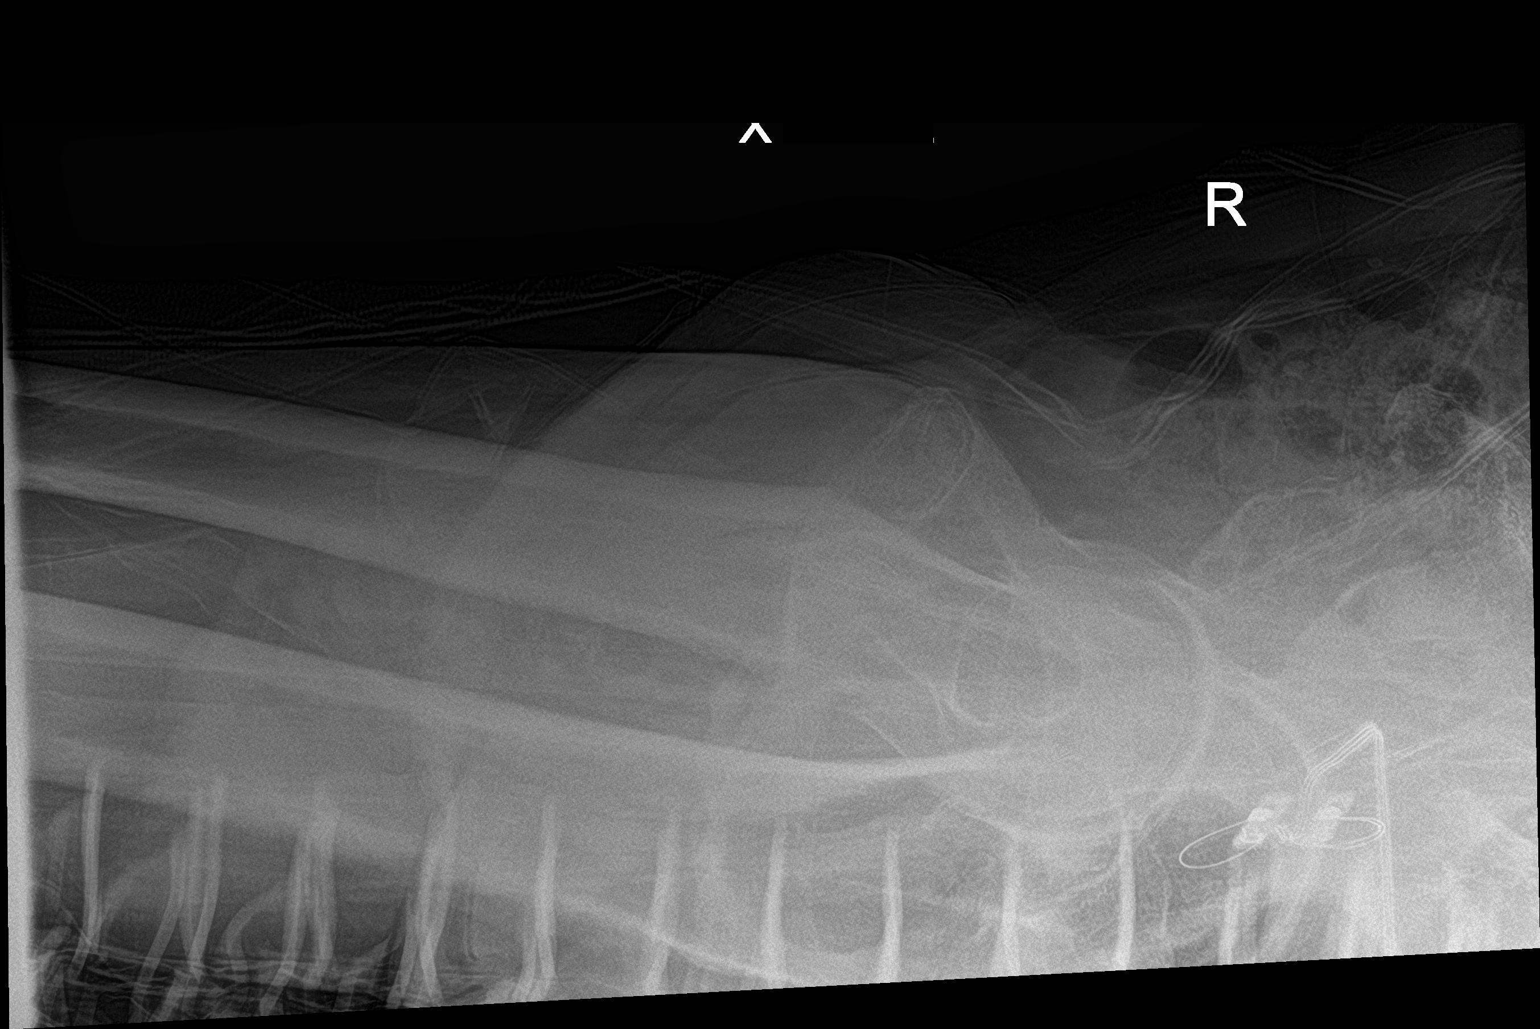

[3 of 3 positions shown; findings below may reference images not displayed]

FINDINGS: Acute, comminuted inter trochanteric fracture is seen involving the
proximal right femur. There is no evidence of dislocation. Soft
tissue structures are unremarkable.
IMPRESSION: Acute intertrochanteric fracture of the proximal right femur.

## 2022-06-27 ENCOUNTER — Other Ambulatory Visit: Payer: Self-pay

## 2022-06-27 DIAGNOSIS — Z1231 Encounter for screening mammogram for malignant neoplasm of breast: Secondary | ICD-10-CM

## 2022-06-30 ENCOUNTER — Ambulatory Visit: Payer: Self-pay | Attending: Hematology and Oncology | Admitting: Hematology and Oncology

## 2022-06-30 ENCOUNTER — Ambulatory Visit
Admission: RE | Admit: 2022-06-30 | Discharge: 2022-06-30 | Disposition: A | Discharge status: Home / Self Care | Payer: Self-pay | Source: Ambulatory Visit | Attending: Obstetrics and Gynecology | Admitting: Obstetrics and Gynecology

## 2022-06-30 ENCOUNTER — Other Ambulatory Visit: Payer: Self-pay

## 2022-06-30 VITALS — BP 138/75 | Wt 112.5 lb

## 2022-06-30 DIAGNOSIS — Z1231 Encounter for screening mammogram for malignant neoplasm of breast: Secondary | ICD-10-CM | POA: Insufficient documentation

## 2022-06-30 DIAGNOSIS — Z1211 Encounter for screening for malignant neoplasm of colon: Secondary | ICD-10-CM

## 2022-06-30 NOTE — Progress Notes (Signed)
[name] is a 56 y.o. female who presents to [facility] today with no complaints.    Pap Smear: Pap not smear completed today. Last Pap smear was 09/19/21 at [facility] and was normal. Per patient has history of an abnormal Pap smear. Last Pap smear result is not available in Epic. 20 years ago abnormal, 3 normals since.    Physical exam: Breasts Breasts symmetrical. No skin abnormalities bilateral breasts. No nipple retraction bilateral breasts. No nipple discharge bilateral breasts. No lymphadenopathy. No lumps palpated bilateral breasts.     MS DIGITAL SCREENING TOMO BILATERAL  Result Date: 12/20/2019 CLINICAL DATA:  Screening. EXAM: DIGITAL SCREENING BILATERAL MAMMOGRAM WITH TOMO AND CAD COMPARISON:  Previous exam(s). ACR Breast Density Category c: The breast tissue is heterogeneously dense, which may obscure small masses. FINDINGS: There are no findings suspicious for malignancy. Images were processed with CAD. IMPRESSION: No mammographic evidence of malignancy. A result letter of this screening mammogram will be mailed directly to the patient. RECOMMENDATION: Screening mammogram in one year. (Code:SM-B-01Y) BI-RADS CATEGORY  1: Negative. Electronically Signed   By: Frederico Hamman M.D.   On: 12/20/2019 16:20      Pelvic/Bimanual Pap is not indicated today    Smoking History: Patient has is a current smoker at 1 packs per day and was referred to quit line.    Patient Navigation: Patient education provided. Access to services provided for patient through [facility] program. No interpreter provided. No transportation provided   Colorectal Cancer Screening: Per patient has never had colonoscopy completed No complaints today. FIT test given.   Breast and Cervical Cancer Risk Assessment: Patient does not have family history of breast cancer, known genetic mutations, or radiation treatment to the chest before age 20. Patient has history of cervical dysplasia, immunocompromised, or  DES exposure in-utero.  Risk Scores as of 06/30/2022     Michele Bernard           5-year 1.04 %   Lifetime 6.01 %            Last calculated by Narda Rutherford, LPN on 19/12/2227 at  2:29 PM        A: [facility] exam without pap smear No complaints with benign exam.   P: [facility]. Appointment scheduled 06/30/22.  Pascal Lux, NP 06/30/2022 2:38 PM

## 2022-06-30 NOTE — Patient Instructions (Signed)
Taught Kirkland Hun about self breast awareness and gave educational materials to take home. Patient did not need a Pap smear today due to last Pap smear was in 2023 per patient. Due again in 2028. Let her know [facility] will cover Pap smears every 5 years unless has a history of abnormal Pap smears. [facility] to [facility] for [facility]. Appointment scheduled for 06/30/22. Patient aware of appointment and will be there. Let patient know will follow up with her within the next couple weeks with results. Kirkland Hun verbalized understanding.  Pascal Lux, NP 2:26 PM

## 2023-09-14 ENCOUNTER — Encounter: Payer: Self-pay | Admitting: Family Medicine

## 2023-09-14 ENCOUNTER — Other Ambulatory Visit: Payer: Self-pay | Admitting: Family Medicine

## 2023-09-14 DIAGNOSIS — Z1231 Encounter for screening mammogram for malignant neoplasm of breast: Secondary | ICD-10-CM

## 2023-09-22 ENCOUNTER — Ambulatory Visit
Admission: RE | Admit: 2023-09-22 | Discharge: 2023-09-22 | Disposition: A | Discharge status: Home / Self Care | Payer: Medicaid Other | Source: Ambulatory Visit | Attending: Family Medicine | Admitting: Family Medicine

## 2023-09-22 DIAGNOSIS — Z1231 Encounter for screening mammogram for malignant neoplasm of breast: Secondary | ICD-10-CM | POA: Insufficient documentation

## 2024-03-16 ENCOUNTER — Other Ambulatory Visit: Payer: Self-pay

## 2024-03-16 ENCOUNTER — Ambulatory Visit: Admitting: Anesthesiology

## 2024-03-16 ENCOUNTER — Ambulatory Visit
Admission: RE | Admit: 2024-03-16 | Discharge: 2024-03-16 | Disposition: A | Discharge status: Home / Self Care | Attending: Gastroenterology | Admitting: Gastroenterology

## 2024-03-16 ENCOUNTER — Encounter: Payer: Self-pay | Admitting: Gastroenterology

## 2024-03-16 ENCOUNTER — Encounter
Admission: RE | Disposition: A | Discharge status: Home / Self Care | Payer: Self-pay | Source: Home / Self Care | Attending: Gastroenterology

## 2024-03-16 DIAGNOSIS — J449 Chronic obstructive pulmonary disease, unspecified: Secondary | ICD-10-CM | POA: Diagnosis not present

## 2024-03-16 DIAGNOSIS — I10 Essential (primary) hypertension: Secondary | ICD-10-CM | POA: Insufficient documentation

## 2024-03-16 DIAGNOSIS — K644 Residual hemorrhoidal skin tags: Secondary | ICD-10-CM | POA: Diagnosis not present

## 2024-03-16 DIAGNOSIS — K5730 Diverticulosis of large intestine without perforation or abscess without bleeding: Secondary | ICD-10-CM | POA: Insufficient documentation

## 2024-03-16 DIAGNOSIS — R0902 Hypoxemia: Secondary | ICD-10-CM | POA: Diagnosis not present

## 2024-03-16 DIAGNOSIS — Z5986 Financial insecurity: Secondary | ICD-10-CM | POA: Diagnosis not present

## 2024-03-16 DIAGNOSIS — K219 Gastro-esophageal reflux disease without esophagitis: Secondary | ICD-10-CM | POA: Diagnosis not present

## 2024-03-16 DIAGNOSIS — Z8249 Family history of ischemic heart disease and other diseases of the circulatory system: Secondary | ICD-10-CM | POA: Diagnosis not present

## 2024-03-16 DIAGNOSIS — F17210 Nicotine dependence, cigarettes, uncomplicated: Secondary | ICD-10-CM | POA: Insufficient documentation

## 2024-03-16 DIAGNOSIS — Z1211 Encounter for screening for malignant neoplasm of colon: Secondary | ICD-10-CM | POA: Diagnosis present

## 2024-03-16 DIAGNOSIS — K222 Esophageal obstruction: Secondary | ICD-10-CM | POA: Diagnosis not present

## 2024-03-16 DIAGNOSIS — D122 Benign neoplasm of ascending colon: Secondary | ICD-10-CM | POA: Insufficient documentation

## 2024-03-16 DIAGNOSIS — R1314 Dysphagia, pharyngoesophageal phase: Secondary | ICD-10-CM | POA: Diagnosis present

## 2024-03-16 HISTORY — PX: ESOPHAGOGASTRODUODENOSCOPY: SHX5428

## 2024-03-16 HISTORY — PX: POLYPECTOMY: SHX149

## 2024-03-16 HISTORY — PX: COLONOSCOPY: SHX5424

## 2024-03-16 SURGERY — COLONOSCOPY
Anesthesia: General

## 2024-03-16 MED ORDER — LIDOCAINE HCL (CARDIAC) PF 100 MG/5ML IV SOSY
PREFILLED_SYRINGE | INTRAVENOUS | Status: DC | PRN
  Administered 2024-03-16: 50 mg via INTRAVENOUS

## 2024-03-16 MED ORDER — PROPOFOL 500 MG/50ML IV EMUL
INTRAVENOUS | Status: DC | PRN
  Administered 2024-03-16: 75 ug/kg/min via INTRAVENOUS

## 2024-03-16 MED ORDER — SODIUM CHLORIDE 0.9 % IV SOLN: INTRAVENOUS | Status: DC

## 2024-03-16 MED ORDER — LIDOCAINE HCL (PF) 2 % IJ SOLN
INTRAMUSCULAR | Status: AC
  Filled 2024-03-16: qty 5

## 2024-03-16 MED ORDER — PROPOFOL 10 MG/ML IV BOLUS
INTRAVENOUS | Status: DC | PRN
  Administered 2024-03-16: 50 mg via INTRAVENOUS
  Administered 2024-03-16: 30 mg via INTRAVENOUS
  Administered 2024-03-16: 20 mg via INTRAVENOUS

## 2024-03-16 MED ORDER — DEXMEDETOMIDINE HCL IN NACL 80 MCG/20ML IV SOLN
INTRAVENOUS | Status: DC | PRN
Start: 2024-03-16 — End: 2024-03-16
  Administered 2024-03-16: 12 ug via INTRAVENOUS

## 2024-03-16 MED ORDER — GLYCOPYRROLATE 0.2 MG/ML IJ SOLN
INTRAMUSCULAR | Status: DC | PRN
  Administered 2024-03-16: .2 mg via INTRAVENOUS

## 2024-03-16 NOTE — Transfer of Care (Signed)
 Immediate Anesthesia Transfer of Care Note  Patient: Michele Bernard  Procedure(s) Performed: COLONOSCOPY EGD (ESOPHAGOGASTRODUODENOSCOPY) POLYPECTOMY, INTESTINE  Patient Location: PACU  Anesthesia Type:General  Level of Consciousness: sedated  Airway & Oxygen Therapy: Patient Spontanous Breathing  Post-op Assessment: Report given to RN and Post -op Vital signs reviewed and stable  Post vital signs: Reviewed and stable  Last Vitals:  Vitals Value Taken Time  BP    Temp    Pulse    Resp    SpO2      Last Pain:  Vitals:   03/16/24 1034  TempSrc:   PainSc: Asleep         Complications: No notable events documented.

## 2024-03-16 NOTE — H&P (Signed)
 Michele JONELLE Brooklyn, MD [facility], DHIP [address]  [place], [place] [address]  Main: [phone] Fax:  [phone] Pager: [phone]   Primary Care Physician:  Landy Margaretann Kay, FNP Primary Gastroenterologist:  Dr. Corinn JONELLE Bernard  Pre-Procedure History & Physical: HPI:  Michele Bernard is a 58 y.o. female is here for an endoscopy and colonoscopy.   Past Medical History:  Diagnosis Date   GERD (gastroesophageal reflux disease)    Hyperlipidemia    Hypertension     Past Surgical History:  Procedure Laterality Date   BLADDER SURGERY     Bladder tact   BREAST BIOPSY Left 07/03/2016   path pending   INTRAMEDULLARY (IM) NAIL INTERTROCHANTERIC Right 03/28/2021   Procedure: INTRAMEDULLARY (IM) NAIL INTERTROCHANTRIC;  Surgeon: Tobie Priest, MD;  Location: [facility] ORS;  Service: Orthopedics;  Laterality: Right;    Prior to Admission medications   Medication Sig Start Date End Date Taking? Authorizing Provider  hydrochlorothiazide (HYDRODIURIL) 25 MG tablet Take 25 mg by mouth daily.   Yes [provider]  albuterol  (PROVENTIL  HFA;VENTOLIN  HFA) 108 (90 Base) MCG/ACT inhaler Inhale 2 puffs into the lungs every 4 (four) hours as needed for wheezing or shortness of breath. 07/13/17   Cuthriell, Dorn BIRCH, PA-C  atorvastatin (LIPITOR) 20 MG tablet Take 20 mg by mouth daily.    [provider]  celecoxib (CELEBREX) 200 MG capsule Take 200 mg by mouth 2 (two) times daily. 10/02/20   [provider]  enoxaparin  (LOVENOX ) 40 MG/0.4ML injection Inject 0.4 mLs (40 mg total) into the skin daily for 14 days. 03/29/21 04/12/21  Verlinda Boas, PA-C  fenofibrate  (TRICOR ) 145 MG tablet Take 145 mg by mouth daily. 12/10/20   [provider]  ferrous sulfate  325 (65 FE) MG tablet Take 1 tablet (325 mg total) by mouth daily. 03/31/21 04/30/21  Josette Ade, MD  magnesium  oxide (MAG-OX) 400 (240 Mg) MG tablet Take 1 tablet (400 mg total) by  mouth daily. Patient not taking: Reported on 03/16/2024 04/01/21   Josette Ade, MD  Omega-3 Fatty Acids (FISH OIL) 1000 MG CAPS Take 1 capsule by mouth 2 (two) times daily. Patient not taking: Reported on 03/16/2024 03/25/21   [provider]  omeprazole (PRILOSEC) 40 MG capsule Take 40 mg by mouth daily. Patient not taking: Reported on 03/16/2024    [provider]  oxyCODONE  (OXY IR/ROXICODONE ) 5 MG immediate release tablet Take 0.5-1 tablets (2.5-5 mg total) by mouth every 4 (four) hours as needed for moderate pain (pain score 4-6). 03/29/21   Verlinda Boas, PA-C  traMADol  (ULTRAM ) 50 MG tablet Take 1 tablet (50 mg total) by mouth every 6 (six) hours as needed for moderate pain. 03/29/21   Verlinda Boas, PA-C    Allergies as of 02/29/2024 - Review Complete 06/30/2022  Allergen Reaction Noted   Penicillins Hives 08/16/2015   Ancef  [cefazolin ] Rash 03/28/2021    Family History  Problem Relation Age of Onset   Hypertension Mother    Breast cancer Neg Hx     Social History   Socioeconomic History   Marital status: Single    Spouse name: Not on file   Number of children: 2   Years of education: Not on file   Highest education level: GED or equivalent  Occupational History   Not on file  Tobacco Use   Smoking status: Every Day    Current packs/day: 1.00    Average packs/day: 1 pack/day for 30.0 years (30.0  ttl pk-yrs)    Types: Cigarettes   Smokeless tobacco: Never  Vaping Use   Vaping status: Never Used  Substance and Sexual Activity   Alcohol use: No   Drug use: No   Sexual activity: Yes    Birth control/protection: Post-menopausal  Other Topics Concern   Not on file  Social History Narrative   Not on file   Social Drivers of Health   Financial Resource Strain: Medium Risk (03/02/2024)   Received from [facility]   Overall Financial Resource Strain (CARDIA)    Difficulty of Paying Living Expenses: Somewhat hard  Food Insecurity: No Food  Insecurity (03/02/2024)   Received from [facility]   Hunger Vital Sign    Within the past 12 months, you worried that your food would run out before you got the money to buy more.: Never true    Within the past 12 months, the food you bought just didn't last and you didn't have money to get more.: Never true  Transportation Needs: No Transportation Needs (03/02/2024)   Received from [facility] - Transportation    In the past 12 months, has lack of transportation kept you from medical appointments or from getting medications?: No    Lack of Transportation (Non-Medical): No  Physical Activity: Not on file  Stress: Not on file  Social Connections: Not on file  Intimate Partner Violence: Not on file    Review of Systems: See HPI, otherwise negative ROS  Physical Exam: BP (!) 158/83   Pulse 87   Temp (!) 96.7 F (35.9 C) (Tympanic)   Resp 18   Ht 5' 6 (1.676 m)   Wt 47.2 kg   SpO2 96%   BMI 16.79 kg/m  General:   Alert,  pleasant and cooperative in NAD Head:  Normocephalic and atraumatic. Neck:  Supple; no masses or thyromegaly. Lungs:  Clear throughout to auscultation.    Heart:  Regular rate and rhythm. Abdomen:  Soft, nontender and nondistended. Normal bowel sounds, without guarding, and without rebound.   Neurologic:  Alert and  oriented x4;  grossly normal neurologically.  Impression/Plan: Michele Bernard is here for an endoscopy and colonoscopy to be performed for esophageal dysphagia, GERD, and colon cancer screening   Risks, benefits, limitations, and alternatives regarding  endoscopy and colonoscopy have been reviewed with the patient.  Questions have been answered.  All parties agreeable.   Michele Brooklyn, MD  03/16/2024, 9:49 AM

## 2024-03-16 NOTE — Anesthesia Preprocedure Evaluation (Addendum)
 Anesthesia Evaluation  Patient identified by MRN, date of birth, ID band Patient awake    Reviewed: Allergy & Precautions, NPO status , Patient's Chart, lab work & pertinent test results  History of Anesthesia Complications Negative for: history of anesthetic complications  Airway Mallampati: II   Neck ROM: Full    Dental  (+) Chipped   Pulmonary COPD, Current Smoker (1 ppd) and Patient abstained from smoking.   Pulmonary exam normal breath sounds clear to auscultation       Cardiovascular hypertension, Normal cardiovascular exam Rhythm:Regular Rate:Normal     Neuro/Psych  PSYCHIATRIC DISORDERS Anxiety        GI/Hepatic ,GERD  ,,  Endo/Other  negative endocrine ROS    Renal/GU negative Renal ROS     Musculoskeletal   Abdominal   Peds  Hematology  (+) Blood dyscrasia, anemia   Anesthesia Other Findings   Reproductive/Obstetrics                              Anesthesia Physical Anesthesia Plan  ASA: 2  Anesthesia Plan: General   Post-op Pain Management:    Induction: Intravenous  PONV Risk Score and Plan: 2 and Propofol  infusion, TIVA and Treatment may vary due to age or medical condition  Airway Management Planned: Natural Airway  Additional Equipment:   Intra-op Plan:   Post-operative Plan:   Informed Consent: I have reviewed the patients History and Physical, chart, labs and discussed the procedure including the risks, benefits and alternatives for the proposed anesthesia with the patient or authorized representative who has indicated his/her understanding and acceptance.       Plan Discussed with: CRNA  Anesthesia Plan Comments: (LMA/GETA backup discussed.  Patient consented for risks of anesthesia including but not limited to:  - adverse reactions to medications - damage to eyes, teeth, lips or other oral mucosa - nerve damage due to positioning  - sore throat or  hoarseness - damage to heart, brain, nerves, lungs, other parts of body or loss of life  Informed patient about role of CRNA in peri- and intra-operative care.  Patient voiced understanding.)         Anesthesia Quick Evaluation

## 2024-03-16 NOTE — Op Note (Signed)
 [facility] Patient Name: Michele Bernard Procedure Date: 03/16/2024 9:47 AM MRN: [id number] Account #: [id number] Date of Birth: Oct 30, 1965 Admit Type: Outpatient Age: 58 Room: [facility] ENDO ROOM 4 Gender: Female Note Status: Finalized Instrument Name: Peds Colonoscope [id number] Procedure:             Colonoscopy Indications:           Screening for colorectal malignant neoplasm Providers:             Corinn Jess Brooklyn MD, MD Referring MD:          Margaretann Seip, MD Medicines:             General Anesthesia Complications:         No immediate complications. Estimated blood loss: None. Procedure:             Pre-Anesthesia Assessment:                        - Prior to the procedure, a History and Physical was                         performed, and patient medications and allergies were                         reviewed. The patient is competent. The risks and                         benefits of the procedure and the sedation options and                         risks were discussed with the patient. All questions                         were answered and informed consent was obtained.                         Patient identification and proposed procedure were                         verified by the physician, the nurse, the                         anesthesiologist, the anesthetist and the technician                         in the pre-procedure area in the procedure room in the                         endoscopy suite. Mental Status Examination: alert and                         oriented. Airway Examination: normal oropharyngeal                         airway and neck mobility. Respiratory Examination:                         clear to auscultation. CV Examination: normal.  Prophylactic Antibiotics: The patient does not require                         prophylactic antibiotics. Prior Anticoagulants: The                         patient has  taken no anticoagulant or antiplatelet                         agents. ASA Grade Assessment: II - A patient with mild                         systemic disease. After reviewing the risks and                         benefits, the patient was deemed in satisfactory                         condition to undergo the procedure. The anesthesia                         plan was to use general anesthesia. Immediately prior                         to administration of medications, the patient was                         re-assessed for adequacy to receive sedatives. The                         heart rate, respiratory rate, oxygen saturations,                         blood pressure, adequacy of pulmonary ventilation, and                         response to care were monitored throughout the                         procedure. The physical status of the patient was                         re-assessed after the procedure.                        After obtaining informed consent, the colonoscope was                         passed under direct vision. Throughout the procedure,                         the patient's blood pressure, pulse, and oxygen                         saturations were monitored continuously. The                         Colonoscope was introduced through the anus and  advanced to the the terminal ileum, with                         identification of the appendiceal orifice and IC                         valve. The colonoscopy was performed with moderate                         difficulty due to significant looping. Successful                         completion of the procedure was aided by applying                         abdominal pressure. The patient tolerated the                         procedure well. The quality of the bowel preparation                         was evaluated using the BBPS [facility] Bowel Preparation                         Scale) with scores of:  Right Colon = 3, Transverse                         Colon = 3 and Left Colon = 3 (entire mucosa seen well                         with no residual staining, small fragments of stool or                         opaque liquid). The total BBPS score equals 9. The                         terminal ileum, ileocecal valve, appendiceal orifice,                         and rectum were photographed. Findings:      Hemorrhoids were found on perianal exam.      The terminal ileum appeared normal.      A 4 mm polyp was found in the ascending colon. The polyp was sessile.       The polyp was removed with a cold snare. Resection and retrieval were       complete. Estimated blood loss: none.      Multiple diverticula were found in the entire colon.      Non-bleeding external hemorrhoids were found during retroflexion. The       hemorrhoids were medium-sized. Impression:            - Hemorrhoids found on perianal exam.                        - The examined portion of the ileum was normal.                        - One 4  mm polyp in the ascending colon, removed with                         a cold snare. Resected and retrieved.                        - Diverticulosis in the entire examined colon.                        - Non-bleeding external hemorrhoids. Recommendation:        - Discharge patient to home (with escort).                        - Resume previous diet today.                        - Continue present medications.                        - Await pathology results.                        - Repeat colonoscopy in 5 years for surveillance. Procedure Code(s):     --- Professional ---                        [phone], Colonoscopy, flexible; with removal of                         tumor(s), polyp(s), or other lesion(s) by snare                         technique Diagnosis Code(s):     --- Professional ---                        Z12.11, Encounter for screening for malignant neoplasm                          of colon                        K64.4, Residual hemorrhoidal skin tags                        D12.2, Benign neoplasm of ascending colon                        K57.30, Diverticulosis of large intestine without                         perforation or abscess without bleeding CPT copyright 2022 American Medical Association. All rights reserved. The codes documented in this report are preliminary and upon coder review may  be revised to meet current compliance requirements. Dr. Corinn Brooklyn Corinn Jess Brooklyn MD, MD 03/16/2024 10:34:25 AM This report has been signed electronically. Number of Addenda: 0 Note Initiated On: 03/16/2024 9:47 AM Scope Withdrawal Time: 0 hours 10 minutes 16 seconds  Total Procedure Duration: 0 hours 14 minutes 19 seconds  Estimated Blood Loss:  Estimated blood loss: none.      [facility]

## 2024-03-16 NOTE — Op Note (Signed)
 [facility] Patient Name: Adena Sima Procedure Date: 03/16/2024 9:48 AM MRN: [id number] Account #: [id number] Date of Birth: 1965-08-29 Admit Type: Outpatient Age: 58 Room: [facility] ENDO ROOM 4 Gender: Female Note Status: Finalized Instrument Name: Upper GI Scope [id number] Procedure:             Upper GI endoscopy Indications:           Esophageal dysphagia Providers:             Corinn Jess Brooklyn MD, MD Referring MD:          Margaretann Seip, MD Medicines:             General Anesthesia Complications:         No immediate complications. Estimated blood loss: None. Procedure:             Pre-Anesthesia Assessment:                        - Prior to the procedure, a History and Physical was                         performed, and patient medications and allergies were                         reviewed. The patient is competent. The risks and                         benefits of the procedure and the sedation options and                         risks were discussed with the patient. All questions                         were answered and informed consent was obtained.                         Patient identification and proposed procedure were                         verified by the physician, the nurse, the                         anesthesiologist, the anesthetist and the technician                         in the pre-procedure area in the procedure room in the                         endoscopy suite. Mental Status Examination: alert and                         oriented. Airway Examination: normal oropharyngeal                         airway and neck mobility. Respiratory Examination:                         clear to auscultation. CV Examination: normal.  Prophylactic Antibiotics: The patient does not require                         prophylactic antibiotics. Prior Anticoagulants: The                         patient has taken no  anticoagulant or antiplatelet                         agents. ASA Grade Assessment: II - A patient with mild                         systemic disease. After reviewing the risks and                         benefits, the patient was deemed in satisfactory                         condition to undergo the procedure. The anesthesia                         plan was to use general anesthesia. Immediately prior                         to administration of medications, the patient was                         re-assessed for adequacy to receive sedatives. The                         heart rate, respiratory rate, oxygen saturations,                         blood pressure, adequacy of pulmonary ventilation, and                         response to care were monitored throughout the                         procedure. The physical status of the patient was                         re-assessed after the procedure.                        After obtaining informed consent, the endoscope was                         passed under direct vision. Throughout the procedure,                         the patient's blood pressure, pulse, and oxygen                         saturations were monitored continuously. The Endoscope                         was introduced through the mouth, and advanced to the  second part of duodenum. The upper GI endoscopy was                         accomplished without difficulty. The patient tolerated                         the procedure fairly well. Findings:      The duodenal bulb and second portion of the duodenum were normal.      The entire examined stomach was normal.      The cardia and gastric fundus were normal on retroflexion.      A non-obstructing and mild Schatzki ring was found in the distal       esophagus. Empiric dilation could not be perfomed due to hypoxia during       the procedure      The examined esophagus was normal. Biopsies were taken with  a cold       forceps for histology. Impression:            - Normal duodenal bulb and second portion of the                         duodenum.                        - Normal stomach.                        - Non-obstructing and mild Schatzki ring.                        - Normal esophagus. Biopsied. Recommendation:        - Await pathology results.                        - Follow an antireflux regimen for the rest of the                         patient's life.                        - Use a proton pump inhibitor PO daily.                        - Proceed with colonoscopy as scheduled                        See colonoscopy report Procedure Code(s):     --- Professional ---                        [phone], Esophagogastroduodenoscopy, flexible,                         transoral; with biopsy, single or multiple Diagnosis Code(s):     --- Professional ---                        K22.2, Esophageal obstruction                        R13.14, Dysphagia, pharyngoesophageal phase CPT copyright 2022 [employer]. All rights reserved. The codes documented  in this report are preliminary and upon coder review may  be revised to meet current compliance requirements. Dr. Corinn Brooklyn Corinn Jess Brooklyn MD, MD 03/16/2024 10:13:01 AM This report has been signed electronically. Number of Addenda: 0 Note Initiated On: 03/16/2024 9:48 AM Estimated Blood Loss:  Estimated blood loss: none.      [facility]

## 2024-03-17 LAB — SURGICAL PATHOLOGY

## 2024-03-17 NOTE — Anesthesia Postprocedure Evaluation (Signed)
 Anesthesia Post Note  Patient: Michele Bernard  Procedure(s) Performed: COLONOSCOPY EGD (ESOPHAGOGASTRODUODENOSCOPY) POLYPECTOMY, INTESTINE  Patient location during evaluation: PACU Anesthesia Type: General Level of consciousness: awake and alert, oriented and patient cooperative Pain management: pain level controlled Vital Signs Assessment: post-procedure vital signs reviewed and stable Respiratory status: spontaneous breathing, nonlabored ventilation and respiratory function stable Cardiovascular status: blood pressure returned to baseline and stable Postop Assessment: adequate PO intake Anesthetic complications: no   No notable events documented.   Last Vitals:  Vitals:   03/16/24 1034 03/16/24 1044  BP: (!) 158/104 (!) 157/82  Pulse: 85 (!) 104  Resp: (!) 23 20  Temp:    SpO2: 92% 95%    Last Pain:  Vitals:   03/16/24 1044  TempSrc:   PainSc: 0-No pain                 Alfonso Ruths

## 2024-03-18 ENCOUNTER — Ambulatory Visit: Payer: Self-pay | Admitting: Gastroenterology
